# Patient Record
Sex: Male | Born: 1945 | Race: White | Hispanic: No | Marital: Single | Smoking: Current every day smoker
Health system: Southern US, Community
[De-identification: ages and names within clinical notes are randomized; demographics above are authoritative.]

## PROBLEM LIST (undated history)

## (undated) DIAGNOSIS — I4891 Unspecified atrial fibrillation: Secondary | ICD-10-CM

## (undated) DIAGNOSIS — I509 Heart failure, unspecified: Secondary | ICD-10-CM

## (undated) HISTORY — PX: FINGER SURGERY: SHX640

## (undated) HISTORY — PX: VASECTOMY: SHX75

---

## 2013-04-19 ENCOUNTER — Encounter: Payer: Self-pay | Admitting: Nurse Practitioner

## 2013-04-19 ENCOUNTER — Ambulatory Visit (INDEPENDENT_AMBULATORY_CARE_PROVIDER_SITE_OTHER): Payer: Self-pay | Admitting: Nurse Practitioner

## 2013-04-19 VITALS — BP 109/76 | HR 147 | Temp 97.6°F | Ht 69.0 in | Wt 228.0 lb

## 2013-04-19 DIAGNOSIS — R0602 Shortness of breath: Secondary | ICD-10-CM

## 2013-04-19 DIAGNOSIS — I872 Venous insufficiency (chronic) (peripheral): Secondary | ICD-10-CM

## 2013-04-19 DIAGNOSIS — I8310 Varicose veins of unspecified lower extremity with inflammation: Secondary | ICD-10-CM

## 2013-04-19 MED ORDER — POTASSIUM CHLORIDE ER 20 MEQ PO TBCR: 40.0000 meq | EXTENDED_RELEASE_TABLET | Freq: Three times a day (TID) | ORAL | Status: DC

## 2013-04-19 MED ORDER — FUROSEMIDE 80 MG PO TABS: 80.0000 mg | ORAL_TABLET | Freq: Two times a day (BID) | ORAL | Status: DC

## 2013-04-19 MED ORDER — METOPROLOL SUCCINATE ER 25 MG PO TB24: 12.5000 mg | ORAL_TABLET | Freq: Every day | ORAL | Status: DC

## 2013-04-19 NOTE — Progress Notes (Signed)
Subjective:    Samuel [name] is a 68 y.o. male who wishes to establish care and presents for evaluation of shortness of breath. Dyspnea has been moderate, and generally lasting all day and have been gradually worsening in last few days. Associated symptoms include: leg swelling and "feeling bad". The symptoms are aggravated by activity, talking, and relieved by diuretic: pt took 80 mg furosemide yesterday. Felt much better., rest, sitting up. Historical care provided by Samuel [name] in [place]. Pt saw him 1 year ago & was prescribed lisinopril, toprol, coumadin, & lasix. Pt has very poor understanding of why these meds were prescribed. He mentions that he had significant nocturia (4-5 times during night) before he started on lasix, then it was reduced to twice/night. Pt stopped all these meds about 1 ya as they "made me sick". He began to feel bad about 5 mos ago after having the "flu". He reports 40 lb. Weight loss in 5 mos. He states he is not eating because food eating makes him feel more SOB. In spite of weight loss, his abdomen feels full. He reports gradual LE swelling, worse in last week. He developed sore LLE few mos ago. Samuel [name] lives alone. He is a retired Emergency planning/management officerpolice officer.   The following portions of the patient's history were reviewed and updated as appropriate: allergies, current medications, past family history, past medical history, past social history, past surgical history and problem list.  Review of Systems Pertinent items are noted in HPI.    Objective:    Physical Exam BP 109/76  Pulse 147  Temp(Src) 97.6 F (36.4 C) (Temporal)  Ht 5\' 9"  (1.753 m)  Wt 228 lb (103.42 kg)  BMI 33.65 kg/m2  SpO2 98% General appearance: alert, cooperative, appears stated age and mild distress Head: Normocephalic, without obvious abnormality, atraumatic Eyes: negative findings: lids and lashes normal and conjunctivae and sclerae normal Lungs: diminished breath sounds bibasilar Heart:  rapid heart rate, distant tones, not perfusing every beat Extremities: edema +2 pitting edema from feet to knees and venous stasis dermatitis noted Pulses: 2+ and symmetric  Cardiographics ECG: supraventricular tachycardia Echocardiogram: not done  Imaging Chest x-ray: Pt refused.  Lab Review  No labs available    Assessment:    Shortness of breath secondary to heart failure SVT Discussed w/Samuel [name] (cardiologist). Pt should go to ER for diuresis, needs to be monitored due to SVT.     [name]    pt advised needs to be transported to ER by ambulance. Pt refuses ambulance & ER. Pt Asks, " what can you do for me?" I replied, " not enough, you need more care, need cardiac monitoring, electrolyte monitoring as fluid is diuresed. You could die on the way home." Pt acknowledges danger & seriousness of situation, yet is adamant that he will not go to hospital. Agrees to labs today & f/u in 2 days. Scripts written for lasix, potassium, & toprol.

## 2013-04-19 NOTE — Patient Instructions (Signed)
You are in heart failure. You need to be in the hospital. I recommend that you leave here & go to an ER. If you choose not to take this advise, you may die at home. Because you have refused ambulance and my medical advice, I have prescribed meds. Please take furosemide twice daily, toprol once daily, potassium three times daily. Please follow up in my office in 2 days if you have not gone to ER and are still alive.  Heart Failure Heart failure means your heart has trouble pumping blood. This makes it hard for your body to work well. Heart failure is usually a long-term (chronic) condition. You must take good care of yourself and follow your doctor's treatment plan. HOME CARE  Take your heart medicine as told by your doctor.  Do not stop taking medicine unless your doctor tells you to.  Do not skip any dose of medicine.  Refill your medicines before they run out.  Take other medicines only as told by your doctor or pharmacist.  Stay active if told by your doctor. The elderly and people with severe heart failure should talk with a doctor about physical activity.  Eat heart healthy foods. Choose foods that are without trans fat and are low in saturated fat, cholesterol, and salt (sodium). This includes fresh or frozen fruits and vegetables, fish, lean meats, fat-free or low-fat dairy foods, whole grains, and high-fiber foods. Lentils and dried peas and beans (legumes) are also good choices.  Limit salt if told by your doctor.  Cook in a healthy way. Roast, grill, broil, bake, poach, steam, or stir-fry foods.  Limit fluids as told by your doctor.  Weigh yourself every morning. Do this after you pee (urinate) and before you eat breakfast. Write down your weight to give to your doctor.  Take your blood pressure and write it down if your doctor tell you to.  Ask your doctor how to check your pulse. Check your pulse as told.  Lose weight if told by your doctor.  Stop smoking or chewing  tobacco. Do not use gum or patches that help you quit without your doctor's approval.  Schedule and go to doctor visits as told.  Nonpregnant women should have no more than 1 drink a day. Men should have no more than 2 drinks a day. Talk to your doctor about drinking alcohol.  Stop illegal drug use.  Stay current with shots (immunizations).  Manage your health conditions as told by your doctor.  Learn to manage your stress.  Rest when you are tired.  If it is really hot outside:  Avoid intense activities.  Use air conditioning or fans, or get in a cooler place.  Avoid caffeine and alcohol.  Wear loose-fitting, lightweight, and light-colored clothing.  If it is really cold outside:  Avoid intense activities.  Layer your clothing.  Wear mittens or gloves, a hat, and a scarf when going outside.  Avoid alcohol.  Learn about heart failure and get support as needed.  Get help to maintain or improve your quality of life and your ability to care for yourself as needed. GET HELP IF:   You gain 03 lb/1.4 kg or more in 1 day or 05 lb/2.3 kg in a week.  You are more short of breath than usual.  You cannot do your normal activities.  You tire easily.  You cough more than normal, especially with activity.  You have any or more puffiness (swelling) in areas such as your hands,  feet, ankles, or belly (abdomen).  You cannot sleep because it is hard to breathe.  You feel like your heart is beating fast (palpitations).  You get dizzy or lightheaded when you stand up. GET HELP RIGHT AWAY IF:   You have trouble breathing.  There is a change in mental status, such as becoming less alert or not being able to focus.  You have chest pain or discomfort.  You faint. MAKE SURE YOU:   Understand these instructions.  Will watch your condition.  Will get help right away if you are not doing well or get worse. Document Released: 10/23/2007 Document Revised: 05/10/2012  Document Reviewed: 08/14/2011 [facility] Patient Information 2014 [place], [place].

## 2013-04-19 NOTE — Assessment & Plan Note (Signed)
Likely secondary to heart failure. Historical meds: toprol, lisinopril, coumadin, furosemide.  Pt stopped all meds 1 yr ago because they "made him sick". SOB, but speaking in full sentences, LE edema, abdominal edema. ECG shows SVT. Recmd pt be transported to ER. Pt refuses to go to ER. No insurance. Worried about who will take care of his dogs. Advised pt he could die. Pt is coherent, states he is a [employer] and he is OK with death. Refuses recommended treatment. CMET, BNP today. Did not order cxr, or cardio ref as pt refuses.  Wrote scripts for furosemide, potassium, toprol. F/u 2 days.

## 2013-04-19 NOTE — Assessment & Plan Note (Signed)
Lesion about 3 cm diameter.  Not priority today. Will address at next visit.

## 2013-04-19 NOTE — Progress Notes (Signed)
Pre visit review using our clinic review tool, if applicable. No additional management support is needed unless otherwise documented below in the visit note. 

## 2013-04-20 LAB — COMPREHENSIVE METABOLIC PANEL
ALT: 14 U/L (ref 0–53)
AST: 25 U/L (ref 0–37)
Albumin: 4.4 g/dL (ref 3.5–5.2)
Alkaline Phosphatase: 49 U/L (ref 39–117)
BUN: 14 mg/dL (ref 6–23)
CALCIUM: 9.4 mg/dL (ref 8.4–10.5)
CHLORIDE: 103 meq/L (ref 96–112)
CO2: 28 meq/L (ref 19–32)
Creatinine, Ser: 1.1 mg/dL (ref 0.4–1.5)
GFR: 68.99 mL/min (ref >60.00)
GLUCOSE: 126 mg/dL — AB (ref 70–99)
POTASSIUM: 4.4 meq/L (ref 3.5–5.1)
Sodium: 139 mEq/L (ref 135–145)
Total Bilirubin: 2 mg/dL — ABNORMAL HIGH (ref 0.3–1.2)
Total Protein: 7.4 g/dL (ref 6.0–8.3)

## 2013-04-20 LAB — BRAIN NATRIURETIC PEPTIDE: PRO B NATRI PEPTIDE: 350 pg/mL — AB (ref 0.0–100.0)

## 2013-04-21 ENCOUNTER — Telehealth: Payer: Self-pay | Admitting: Nurse Practitioner

## 2013-04-21 NOTE — Telephone Encounter (Signed)
BNP 3 times nml. Significant heart failure. CMET : elevated bilirubin, o/w nml. Spoke w/pt. Stressed significance of severity of heart failure.  Again, advised he needs to be in hospital, then should see cardiologist. Again, pt refused. I explained his medicare part A will cover hospital care. Pt was not aware. Unfortunately, continues to refuse to go to hospital. He refuses ofc vist today. He agrees to ov in 1 week. Scheduled for 4/1 2 pm. Advised pt to check BP before taking 2nd lasix: if under 90/60 hold dose. Stressed importance of taking kdur.

## 2013-04-27 ENCOUNTER — Ambulatory Visit (INDEPENDENT_AMBULATORY_CARE_PROVIDER_SITE_OTHER): Payer: Self-pay | Admitting: Nurse Practitioner

## 2013-04-27 VITALS — BP 126/76 | HR 84 | Temp 98.4°F | Wt 224.0 lb

## 2013-04-27 DIAGNOSIS — M549 Dorsalgia, unspecified: Secondary | ICD-10-CM | POA: Insufficient documentation

## 2013-04-27 DIAGNOSIS — I8310 Varicose veins of unspecified lower extremity with inflammation: Secondary | ICD-10-CM

## 2013-04-27 DIAGNOSIS — I872 Venous insufficiency (chronic) (peripheral): Secondary | ICD-10-CM

## 2013-04-27 DIAGNOSIS — I509 Heart failure, unspecified: Secondary | ICD-10-CM | POA: Insufficient documentation

## 2013-04-27 DIAGNOSIS — R0602 Shortness of breath: Secondary | ICD-10-CM

## 2013-04-27 LAB — BASIC METABOLIC PANEL
BUN: 17 mg/dL (ref 6–23)
CO2: 29 mEq/L (ref 19–32)
Calcium: 9 mg/dL (ref 8.4–10.5)
Chloride: 99 mEq/L (ref 96–112)
Creatinine, Ser: 1 mg/dL (ref 0.4–1.5)
GFR: 76.85 mL/min (ref >60.00)
Glucose, Bld: 133 mg/dL — ABNORMAL HIGH (ref 70–99)
Potassium: 3.7 mEq/L (ref 3.5–5.1)
Sodium: 136 mEq/L (ref 135–145)

## 2013-04-27 LAB — POCT URINALYSIS DIPSTICK
Bilirubin, UA: NEGATIVE
Blood, UA: NEGATIVE
Glucose, UA: NEGATIVE
Ketones, UA: NEGATIVE
LEUKOCYTES UA: NEGATIVE
Nitrite, UA: NEGATIVE
PH UA: 6.5
PROTEIN UA: NEGATIVE
Spec Grav, UA: 1.01
Urobilinogen, UA: NEGATIVE

## 2013-04-27 LAB — BRAIN NATRIURETIC PEPTIDE: Pro B Natriuretic peptide (BNP): 480 pg/mL — ABNORMAL HIGH (ref 0.0–100.0)

## 2013-04-27 MED ORDER — TRIAMCINOLONE ACETONIDE 0.1 % EX CREA: 1.0000 "application " | TOPICAL_CREAM | Freq: Two times a day (BID) | CUTANEOUS | Status: DC

## 2013-04-27 MED ORDER — FUROSEMIDE 80 MG PO TABS: 40.0000 mg | ORAL_TABLET | Freq: Every day | ORAL | Status: DC

## 2013-04-27 MED ORDER — METOPROLOL SUCCINATE ER 25 MG PO TB24: 12.5000 mg | ORAL_TABLET | Freq: Every day | ORAL | Status: DC

## 2013-04-27 NOTE — Progress Notes (Signed)
Pre-visit discussion using our clinic review tool. No additional management support is needed unless otherwise documented below in the visit note.  

## 2013-04-27 NOTE — Progress Notes (Signed)
Subjective:    Samuel [name] is a 68 y.o. male who presents for follow-up of congestive heart failure. Pt was seen in ofc last week as new patient, presenting with SOB, tachycardic, and 3+ LE edema. Pt was in SVT per ofc ECG and was instructed to go to hospital due to severity of condition. Pt refused as he was concerned about cost-no insurance, and no one to care for dogs. Labs showed elevated BNP >300. CMET nml. I started metoprolol, lasix, potassium. Pt refused cardio referral for follow up, but agreed to see me today for f/u -his earliest availability.  Today he is much improved-started feeling "back to normal" about 3 days ago. Current symptoms include: fatigue. He denies chest pressure/discomfort, dyspnea, irregular heart beat and lower extremity edema. He states he is taking prescribed meds. He decreased lasix to once daily 3 da as he was not urinating frequently after second dose.  He c/o persistent back pain, onset 20 ya, worse in last week because he has been sitting more than usual due to SOB.  The following portions of the patient's history were reviewed and updated as appropriate: allergies, current medications, past family history, past medical history, past social history, past surgical history and problem list.  Review of Systems Pertinent items are noted in HPI.   Objective:    BP 126/76  Pulse 84  Temp(Src) 98.4 F (36.9 C) (Oral)  Wt 224 lb (101.606 kg)  SpO2 97% General appearance: alert, cooperative, appears stated age, no distress and pale Head: Normocephalic, without obvious abnormality, atraumatic Eyes: negative findings: lids and lashes normal and conjunctivae and sclerae normal Lungs: clear to auscultation bilaterally Heart: irregularly irregular rhythm and systolic murmur Abdomen: no edema, smaller girth than last week Extremities: edema none-much improved from last week and venous stasis dermatitis noted Pulses: 2+ and symmetric  Skin: pale, stasis derm L  LE-about 4 cm X 5 CM Assessment:    CHF: control uncertain, improved, needs improvement, needs to quit smoking, no significant medication side effects noted, stable and needs eval by cardiology-pt refuses due to cost  Stasis dermatitis Back pain, long-standing. Worse in last week [name]:    Disease process and medications discussed. Questions answered fully. Emphasized salt restriction. Encouraged daily monitoring of the patient's weight. Encouraged regular exercise. Follow up in 1 month.  Steroid cream. Ua & culture.

## 2013-04-27 NOTE — Patient Instructions (Addendum)
Decrease furosemide as discussed. You may add second dose if you are short of breath or feet are swelling.  Please consider seeing cardiologist. Go to hospital if you develop shortness of breath or chest pain. Follow up with Dr Drue Novel in 1 month.  Heart Failure Heart failure is a condition in which the heart has trouble pumping blood. This means your heart does not pump blood efficiently for your body to work well. In some cases of heart failure, fluid may back up into your lungs or you may have swelling (edema) in your lower legs. Heart failure is usually a long-term (chronic) condition. It is important for you to take good care of yourself and follow your caregiver's treatment plan. CAUSES  Some health conditions can cause heart failure. Those health conditions include:  High blood pressure (hypertension) causes the heart muscle to work harder than normal. When pressure in the blood vessels is high, the heart needs to pump (contract) with more force in order to circulate blood throughout the body. High blood pressure eventually causes the heart to become stiff and weak.  Coronary artery disease (CAD) is the buildup of cholesterol and fat (plaque) in the arteries of the heart. The blockage in the arteries deprives the heart muscle of oxygen and blood. This can cause chest pain and may lead to a heart attack. High blood pressure can also contribute to CAD.  Heart attack (myocardial infarction) occurs when 1 or more arteries in the heart become blocked. The loss of oxygen damages the muscle tissue of the heart. When this happens, part of the heart muscle dies. The injured tissue does not contract as well and weakens the heart's ability to pump blood.  Abnormal heart valves can cause heart failure when the heart valves do not open and close properly. This makes the heart muscle pump harder to keep the blood flowing.  Heart muscle disease (cardiomyopathy or myocarditis) is damage to the heart muscle from  a variety of causes. These can include drug or alcohol abuse, infections, or unknown reasons. These can increase the risk of heart failure.  Lung disease makes the heart work harder because the lungs do not work properly. This can cause a strain on the heart, leading it to fail.  Diabetes increases the risk of heart failure. High blood sugar contributes to high fat (lipid) levels in the blood. Diabetes can also cause slow damage to tiny blood vessels that carry important nutrients to the heart muscle. When the heart does not get enough oxygen and food, it can cause the heart to become weak and stiff. This leads to a heart that does not contract efficiently.  Other conditions can contribute to heart failure. These include abnormal heart rhythms, thyroid problems, and low blood counts (anemia). Certain unhealthy behaviors can increase the risk of heart failure. Those unhealthy behaviors include:  Being overweight.  Smoking or chewing tobacco.  Eating foods high in fat and cholesterol.  Abusing illicit drugs or alcohol.  Lacking physical activity. SYMPTOMS  Heart failure symptoms may vary and can be hard to detect. Symptoms may include:  Shortness of breath with activity, such as climbing stairs.  Persistent cough.  Swelling of the feet, ankles, legs, or abdomen.  Unexplained weight gain.  Difficulty breathing when lying flat (orthopnea).  Waking from sleep because of the need to sit up and get more air.  Rapid heartbeat.  Fatigue and loss of energy.  Feeling lightheaded, dizzy, or close to fainting.  Loss of appetite.  Nausea.  Increased urination during the night (nocturia). DIAGNOSIS  A diagnosis of heart failure is based on your history, symptoms, physical examination, and diagnostic tests. Diagnostic tests for heart failure may include:  Echocardiography.  Electrocardiography.  Chest X-ray.  Blood tests.  Exercise stress test.  Cardiac  angiography.  Radionuclide scans. TREATMENT  Treatment is aimed at managing the symptoms of heart failure. Medicines, behavioral changes, or surgical intervention may be necessary to treat heart failure.  Medicines to help treat heart failure may include:  Angiotensin-converting enzyme (ACE) inhibitors. This type of medicine blocks the effects of a blood protein called angiotensin-converting enzyme. ACE inhibitors relax (dilate) the blood vessels and help lower blood pressure.  Angiotensin receptor blockers. This type of medicine blocks the actions of a blood protein called angiotensin. Angiotensin receptor blockers dilate the blood vessels and help lower blood pressure.  Water pills (diuretics). Diuretics cause the kidneys to remove salt and water from the blood. The extra fluid is removed through urination. This loss of extra fluid lowers the volume of blood the heart pumps.  Beta blockers. These prevent the heart from beating too fast and improve heart muscle strength.  Digitalis. This increases the force of the heartbeat.  Healthy behavior changes include:  Obtaining and maintaining a healthy weight.  Stopping smoking or chewing tobacco.  Eating heart healthy foods.  Limiting or avoiding alcohol.  Stopping illicit drug use.  Physical activity as directed by your caregiver.  Surgical treatment for heart failure may include:  A procedure to open blocked arteries, repair damaged heart valves, or remove damaged heart muscle tissue.  A pacemaker to improve heart muscle function and control certain abnormal heart rhythms.  An internal cardioverter defibrillator to treat certain serious abnormal heart rhythms.  A left ventricular assist device to assist the pumping ability of the heart. HOME CARE INSTRUCTIONS   Take your medicine as directed by your caregiver. Medicines are important in reducing the workload of your heart, slowing the progression of heart failure, and  improving your symptoms.  Do not stop taking your medicine unless directed by your caregiver.  Do not skip any dose of medicine.  Refill your prescriptions before you run out of medicine. Your medicines are needed every day.  Take over-the-counter medicine only as directed by your caregiver or pharmacist.  Engage in moderate physical activity if directed by your caregiver. Moderate physical activity can benefit some people. The elderly and people with severe heart failure should consult with a caregiver for physical activity recommendations.  Eat heart healthy foods. Food choices should be free of trans fat and low in saturated fat, cholesterol, and salt (sodium). Healthy choices include fresh or frozen fruits and vegetables, fish, lean meats, legumes, fat-free or low-fat dairy products, and whole grain or high fiber foods. Talk to a dietitian to learn more about heart healthy foods.  Limit sodium if directed by your caregiver. Sodium restriction may reduce symptoms of heart failure in some people. Talk to a dietitian to learn more about heart healthy seasonings.  Use healthy cooking methods. Healthy cooking methods include roasting, grilling, broiling, baking, poaching, steaming, or stir-frying. Talk to a dietitian to learn more about healthy cooking methods.  Limit fluids if directed by your caregiver. Fluid restriction may reduce symptoms of heart failure in some people.  Weigh yourself every day. Daily weights are important in the early recognition of excess fluid. You should weigh yourself every morning after you urinate and before you eat breakfast. Wear the same  amount of clothing each time you weigh yourself. Record your daily weight. Provide your caregiver with your weight record.  Monitor and record your blood pressure if directed by your caregiver.  Check your pulse if directed by your caregiver.  Lose weight if directed by your caregiver. Weight loss may reduce symptoms of heart  failure in some people.  Stop smoking or chewing tobacco. Nicotine makes your heart work harder by causing your blood vessels to constrict. Do not use nicotine gum or patches before talking to your caregiver.  Schedule and attend follow-up visits as directed by your caregiver. It is important to keep all your appointments.  Limit alcohol intake to no more than 1 drink per day for nonpregnant women and 2 drinks per day for men. Drinking more than that is harmful to your heart. Tell your caregiver if you drink alcohol several times a week. Talk with your caregiver about whether alcohol is safe for you. If your heart has already been damaged by alcohol or you have severe heart failure, drinking alcohol should be stopped completely.  Stop illicit drug use.  Stay up-to-date with immunizations. It is especially important to prevent respiratory infections through current pneumococcal and influenza immunizations.  Manage other health conditions such as hypertension, diabetes, thyroid disease, or abnormal heart rhythms as directed by your caregiver.  Learn to manage stress.  Plan rest periods when fatigued.  Learn strategies to manage high temperatures. If the weather is extremely hot:  Avoid vigorous physical activity.  Use air conditioning or fans or seek a cooler location.  Avoid caffeine and alcohol.  Wear loose-fitting, lightweight, and light-colored clothing.  Learn strategies to manage cold temperatures. If the weather is extremely cold:  Avoid vigorous physical activity.  Layer clothes.  Wear mittens or gloves, a hat, and a scarf when going outside.  Avoid alcohol.  Obtain ongoing education and support as needed.  Participate or seek rehabilitation as needed to maintain or improve independence and quality of life. SEEK MEDICAL CARE IF:   Your weight increases by 03 lb/1.4 kg in 1 day or 05 lb/2.3 kg in a week.  You have increasing shortness of breath that is unusual for  you.  You are unable to participate in your usual physical activities.  You tire easily.  You cough more than normal, especially with physical activity.  You have any or more swelling in areas such as your hands, feet, ankles, or abdomen.  You are unable to sleep because it is hard to breathe.  You feel like your heart is beating fast (palpitations).  You become dizzy or lightheaded upon standing up. SEEK IMMEDIATE MEDICAL CARE IF:   You have difficulty breathing.  There is a change in mental status such as decreased alertness or difficulty with concentration.  You have a pain or discomfort in your chest.  You have an episode of fainting (syncope). MAKE SURE YOU:   Understand these instructions.  Will watch your condition.  Will get help right away if you are not doing well or get worse. Document Released: 01/13/2005 Document Revised: 05/10/2012 Document Reviewed: 02/05/2012 [facility] Patient Information 2014 [place], [place].

## 2013-04-29 LAB — URINE CULTURE
Colony Count: NO GROWTH
Organism ID, Bacteria: NO GROWTH

## 2013-05-02 ENCOUNTER — Telehealth: Payer: Self-pay | Admitting: Nurse Practitioner

## 2013-05-02 DIAGNOSIS — I509 Heart failure, unspecified: Secondary | ICD-10-CM

## 2013-05-02 MED ORDER — LISINOPRIL 10 MG PO TABS: 10.0000 mg | ORAL_TABLET | Freq: Every day | ORAL | Status: DC

## 2013-05-02 NOTE — Telephone Encounter (Signed)
Pt needs to start on ACEI. Will prescribe lisinopril 10 mg. Should have ECG at next ov as he was on coumadin in past-pt does not know why. Last ecg showed SVT, but he may have had underlying a-fib. Pt has no insurance and has refused cardio referral,  and hospital care when he was in acute failure. I have explained that his Medicare A will cover hospital admission. LM to adv pt to start lisinopril and sodium restriction to 2400 mg day. He is to f/u w/Dr Drue NovelPaz as the drive to [place] is too burdensome.

## 2013-05-16 NOTE — Telephone Encounter (Signed)
LMOM to CB. 

## 2013-05-17 NOTE — Telephone Encounter (Signed)
Spoke with pt and advised him of the message from [place]. Pt understood and states he is doing better. He has an appointment with Dr Drue NovelPaz on May 6 to follow up.

## 2013-06-01 ENCOUNTER — Ambulatory Visit: Payer: Self-pay | Admitting: Nurse Practitioner

## 2013-06-01 ENCOUNTER — Ambulatory Visit: Payer: Self-pay | Admitting: Internal Medicine

## 2013-06-08 ENCOUNTER — Encounter: Payer: Self-pay | Admitting: Internal Medicine

## 2013-06-08 ENCOUNTER — Ambulatory Visit (INDEPENDENT_AMBULATORY_CARE_PROVIDER_SITE_OTHER): Payer: Self-pay | Admitting: Internal Medicine

## 2013-06-08 VITALS — BP 90/60 | HR 80 | Temp 98.5°F | Wt 231.0 lb

## 2013-06-08 DIAGNOSIS — I509 Heart failure, unspecified: Secondary | ICD-10-CM

## 2013-06-08 NOTE — Progress Notes (Signed)
Pre visit review using our clinic review tool, if applicable. No additional management support is needed unless otherwise documented below in the visit note. 

## 2013-06-08 NOTE — Progress Notes (Signed)
Subjective:    Patient ID: Samuel [name], male    DOB: 03-20-1945, 68 y.o.   MRN: [id number]  DOS:  06/08/2013 Type of  visit: New patient to me, recently seen at this office by a different provider, chart reviewed, excerpts:  Samuel [name] is a 68 y.o. male who presents for follow-up of congestive heart failure. Pt was seen in ofc last week as new patient, presenting with SOB, tachycardic, and 3+ LE edema. Pt was in SVT per ofc ECG and was instructed to go to hospital due to severity of condition. Pt refused as Samuel was concerned about cost-no insurance, and no one to care for dogs. Labs showed elevated BNP >300. CMET nml. I started metoprolol, lasix, potassium. Pt refused cardio referral for follow up, but agreed to see me today for f/u -his earliest availability.   Today Samuel is much improved-started feeling "back to normal" about 3 days ago. Current symptoms include: fatigue. Samuel denies chest pressure/discomfort, dyspnea, irregular heart beat and lower extremity edema. Samuel states Samuel is taking prescribed meds. Samuel decreased lasix to once daily 3 da as Samuel was not urinating frequently after second dose.   Samuel c/o persistent back pain, onset 20 ya, worse in last week because Samuel has been sitting more than usual due to SOB.    ROS Since the last time Samuel was seen by Samuel [name] 04-27-13, Samuel is about the same, states that after the initiation of Lasix Samuel got  "60% better"  however at this time Samuel continue with dyspnea on exertion by talking, walking a few steps. Also for some reason Samuel stopped taking potassium and ACEi, Samuel is on Lasix. Complains of leg cramps at night.  Reports several months history of persistent pressure in the left anterior chest,  the pain is not on and off but steady, sometimes worse when Samuel eats?.  Denies fever chills Lower extremity edema improved. No palpitations No nausea, vomiting, diarrhea or blood in the stools. No cough or sputum production.  Past Medical History  Diagnosis  Date  . Asthma     No past surgical history on file.  History   Social History  . Marital Status: Single    Spouse Name: N/A    Number of Children: 2  . Years of Education: N/A   Occupational History  . retired    Social History Main Topics  . Smoking status: Not on file  . Smokeless tobacco: Not on file  . Alcohol Use: Not on file  . Drug Use: Not on file  . Sexual Activity: Not on file   Other Topics Concern  . 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 Samuel lives alone with his 2 dogs. [name]. Samuel has 3 grandchildren.        Medication List       This list is accurate as of: 06/08/13  2:46 PM.  Always use your most recent med list.               furosemide 80 MG tablet  Commonly known as:  LASIX  Take 0.5 tablets (40 mg total) by mouth daily. If SOB or LE swelling, add second dose PRN.     lisinopril 10 MG tablet  Commonly known as:  PRINIVIL,ZESTRIL  Take 1 tablet (10 mg total) by mouth daily.     metoprolol succinate 25 MG 24 hr tablet  Commonly  known as:  TOPROL-XL  Take 0.5 tablets (12.5 mg total) by mouth daily.     Potassium Chloride ER 20 MEQ Tbcr  Take 40 mEq by mouth 3 (three) times daily.     triamcinolone cream 0.1 %  Commonly known as:  KENALOG  Apply 1 application topically 2 (two) times daily. Use sparingly as can cause bleaching and atrophy.           Objective:   Physical Exam There were no vitals taken for this visit. General -- alert, well-developed .  Neck --+ JVD to jaw at 45 HEENT-- Not pale.  Lungs -- Decreased breath sounds at bases, a few crackles?Marland Kitchen. No wheezing. Unable to speak in complete sentences Heart-- Tachycardic, seems irregular, no murmur.  Abdomen-- Not distended, good bowel sounds,soft, non-tender. Extremities-- +/+++ pretibial edema bilaterally  Neurologic--  alert & oriented X3. Speech normal,   strength normal in all extremities.  Psych-- Cognition  and judgment appear intact. Cooperative with normal attention span and concentration. No anxious or depressed appearing.        Assessment & Plan:   Gradual development of difficulty breathing over the last few months, the patient has refused admission to the hospital or seeing the specialist x 2 before . Had partial response to Lasix. On exam Samuel is on heart failure. EKG afib w/ RVR My advice to the patient is: Go to the hospital via ambulance, needs to see cardiology, be admitted to telemetry, rule out for MI, iv diuresis, get a  echocardiogram et Karie Sodacetera. Also told pt  his condition is very treatable and Samuel could be back to normal within  days with proper care. The patient refuses be admitted to the hospital, furthermore, refuses to get a XRs or more labs I told the patient the seriousness of the situation, Samuel could die within minutes or days, if Samuel dies driving Samuel could kill somebody else. This was discussed in terms that a nonmedical person can understand. Samuel still refuses. Because his blood pressure is 90/60, I am afraid to change his medication in any way. After I discussed the case with cardiology over the phone,Samuel agreed with my assessment and recommendations. I told the patient to continue present medications, take an aspirin every day, if Samuel changes his mind at any time Samuel needs to call 911. Samuel expressed understanding and gratitude for my advice.  Chest pressure, steady and also complains of dysphagia sometimes; Samuel is a pipe smoker and at high risk for esophageal cancer, needs GI eval as well .Patient knows my opinion.  Today , I spent more than 30 min with the patient, >50% of the time counseling

## 2013-06-09 ENCOUNTER — Telehealth: Payer: Self-pay | Admitting: Nurse Practitioner

## 2013-06-09 NOTE — Telephone Encounter (Signed)
Relevant patient education mailed to patient.  

## 2013-07-13 ENCOUNTER — Other Ambulatory Visit: Payer: Self-pay | Admitting: Nurse Practitioner

## 2013-07-13 DIAGNOSIS — R0602 Shortness of breath: Secondary | ICD-10-CM

## 2013-07-13 MED ORDER — METOPROLOL SUCCINATE ER 25 MG PO TB24: 12.5000 mg | ORAL_TABLET | Freq: Every day | ORAL | Status: DC

## 2013-07-13 NOTE — Telephone Encounter (Signed)
Hi, This was sent to me by mistake. He is under your care now. Thanks, Eli Lilly and CompanyLayne

## 2013-07-13 NOTE — Telephone Encounter (Signed)
Patient requesting refill on Metoprolol and furosemide to [facility] [address]

## 2013-07-13 NOTE — Telephone Encounter (Signed)
Rx for metoprolol sent to pharmacy. Epic is not letting me sent the furosemide. Message says can't order because of directions. Please advise?

## 2013-07-15 ENCOUNTER — Telehealth: Payer: Self-pay | Admitting: *Deleted

## 2013-07-15 DIAGNOSIS — R0602 Shortness of breath: Secondary | ICD-10-CM

## 2013-07-15 MED ORDER — FUROSEMIDE 40 MG PO TABS: ORAL_TABLET | ORAL | Status: DC

## 2013-07-15 MED ORDER — METOPROLOL SUCCINATE ER 25 MG PO TB24: 12.5000 mg | ORAL_TABLET | Freq: Every day | ORAL | Status: DC

## 2013-07-15 MED ORDER — POTASSIUM CHLORIDE CRYS ER 20 MEQ PO TBCR: 20.0000 meq | EXTENDED_RELEASE_TABLET | Freq: Every day | ORAL | Status: DC

## 2013-07-15 NOTE — Telephone Encounter (Signed)
rx sent

## 2013-08-18 ENCOUNTER — Other Ambulatory Visit: Payer: Self-pay | Admitting: Nurse Practitioner

## 2013-08-18 DIAGNOSIS — R0602 Shortness of breath: Secondary | ICD-10-CM

## 2013-08-18 NOTE — Telephone Encounter (Deleted)
Please advise refill? 

## 2013-08-18 NOTE — Telephone Encounter (Signed)
Pt is seeing Dr Drue NovelPaz now. Please send to him.

## 2013-08-18 NOTE — Telephone Encounter (Signed)
Please advise refill for metoprolol? Patient just had last refill filled.

## 2013-08-19 ENCOUNTER — Other Ambulatory Visit: Payer: Self-pay | Admitting: *Deleted

## 2013-08-19 DIAGNOSIS — R0602 Shortness of breath: Secondary | ICD-10-CM

## 2013-08-19 MED ORDER — METOPROLOL SUCCINATE ER 25 MG PO TB24: 12.5000 mg | ORAL_TABLET | Freq: Every day | ORAL | Status: DC

## 2013-08-19 NOTE — Telephone Encounter (Signed)
Done

## 2013-08-19 NOTE — Telephone Encounter (Signed)
Please advise refill for metoprolol? Patient just had last refill filled. 

## 2013-09-08 ENCOUNTER — Other Ambulatory Visit: Payer: Self-pay | Admitting: Internal Medicine

## 2013-09-08 NOTE — Telephone Encounter (Signed)
Medication previously sent to Pharmacy on 08/19/2013.

## 2013-12-26 ENCOUNTER — Telehealth: Payer: Self-pay | Admitting: Nurse Practitioner

## 2013-12-26 DIAGNOSIS — R0602 Shortness of breath: Secondary | ICD-10-CM

## 2013-12-26 MED ORDER — METOPROLOL SUCCINATE ER 25 MG PO TB24: 12.5000 mg | ORAL_TABLET | Freq: Every day | ORAL | Status: DC

## 2013-12-26 NOTE — Telephone Encounter (Signed)
Pt called-states unable to breathe. Spoke with him for several minutes. He has not taken metoprolol in 4 mos. I advised him to go to ER. Pt in agreement to go to ER.  Will call in metoprolol.

## 2013-12-26 NOTE — Telephone Encounter (Signed)
Advised pt that he needs an appointment or he needs to go to ER if he is short of breath.  Pt insisted he speak with Layne.

## 2013-12-26 NOTE — Telephone Encounter (Signed)
Patient hasn't taken Metoprolol in 4 months. He has had trouble with the pharmacy. He is [facility] [address]. Patient is requesting a CB because he has been having trouble breathing.

## 2013-12-28 ENCOUNTER — Emergency Department (HOSPITAL_COMMUNITY): Payer: Medicare Other

## 2013-12-28 ENCOUNTER — Encounter (HOSPITAL_COMMUNITY): Payer: Self-pay | Admitting: Family Medicine

## 2013-12-28 ENCOUNTER — Inpatient Hospital Stay (HOSPITAL_COMMUNITY)
Admission: EM | Admit: 2013-12-28 | Discharge: 2013-12-30 | DRG: 292 | Discharge status: Against Medical Advice | Payer: Medicare Other | Attending: Interventional Cardiology | Admitting: Interventional Cardiology

## 2013-12-28 DIAGNOSIS — I509 Heart failure, unspecified: Secondary | ICD-10-CM

## 2013-12-28 DIAGNOSIS — Z9114 Patient's other noncompliance with medication regimen: Secondary | ICD-10-CM | POA: Diagnosis present

## 2013-12-28 DIAGNOSIS — Z9119 Patient's noncompliance with other medical treatment and regimen: Secondary | ICD-10-CM | POA: Diagnosis present

## 2013-12-28 DIAGNOSIS — I481 Persistent atrial fibrillation: Secondary | ICD-10-CM | POA: Diagnosis present

## 2013-12-28 DIAGNOSIS — M549 Dorsalgia, unspecified: Secondary | ICD-10-CM | POA: Diagnosis present

## 2013-12-28 DIAGNOSIS — I350 Nonrheumatic aortic (valve) stenosis: Secondary | ICD-10-CM | POA: Insufficient documentation

## 2013-12-28 DIAGNOSIS — R634 Abnormal weight loss: Secondary | ICD-10-CM | POA: Diagnosis present

## 2013-12-28 DIAGNOSIS — I4891 Unspecified atrial fibrillation: Secondary | ICD-10-CM | POA: Diagnosis present

## 2013-12-28 DIAGNOSIS — I5023 Acute on chronic systolic (congestive) heart failure: Principal | ICD-10-CM | POA: Diagnosis present

## 2013-12-28 DIAGNOSIS — R0602 Shortness of breath: Secondary | ICD-10-CM | POA: Diagnosis present

## 2013-12-28 DIAGNOSIS — F17210 Nicotine dependence, cigarettes, uncomplicated: Secondary | ICD-10-CM | POA: Diagnosis present

## 2013-12-28 DIAGNOSIS — I5022 Chronic systolic (congestive) heart failure: Secondary | ICD-10-CM | POA: Insufficient documentation

## 2013-12-28 DIAGNOSIS — Z91148 Patient's other noncompliance with medication regimen for other reason: Secondary | ICD-10-CM

## 2013-12-28 HISTORY — DX: Heart failure, unspecified: I50.9

## 2013-12-28 HISTORY — DX: Unspecified atrial fibrillation: I48.91

## 2013-12-28 LAB — PRO B NATRIURETIC PEPTIDE: PRO B NATRI PEPTIDE: 6571 pg/mL — AB (ref 0–125)

## 2013-12-28 LAB — CBC
HCT: 46.1 % (ref 39.0–52.0)
Hemoglobin: 16 g/dL (ref 13.0–17.0)
MCH: 32.9 pg (ref 26.0–34.0)
MCHC: 34.7 g/dL (ref 30.0–36.0)
MCV: 94.7 fL (ref 78.0–100.0)
Platelets: 194 10*3/uL (ref 150–400)
RBC: 4.87 MIL/uL (ref 4.22–5.81)
RDW: 14.1 % (ref 11.5–15.5)
WBC: 7.6 10*3/uL (ref 4.0–10.5)

## 2013-12-28 LAB — BASIC METABOLIC PANEL
Anion gap: 17 — ABNORMAL HIGH (ref 5–15)
BUN: 23 mg/dL (ref 6–23)
CALCIUM: 9 mg/dL (ref 8.4–10.5)
CO2: 20 meq/L (ref 19–32)
Chloride: 97 mEq/L (ref 96–112)
Creatinine, Ser: 0.97 mg/dL (ref 0.50–1.35)
GFR calc Af Amer: >90 mL/min (ref >90)
GFR, EST NON AFRICAN AMERICAN: 82 mL/min — AB (ref >90)
Glucose, Bld: 178 mg/dL — ABNORMAL HIGH (ref 70–99)
Potassium: 4.9 mEq/L (ref 3.7–5.3)
SODIUM: 134 meq/L — AB (ref 137–147)

## 2013-12-28 LAB — HEPARIN LEVEL (UNFRACTIONATED): Heparin Unfractionated: 0.45 IU/mL (ref 0.30–0.70)

## 2013-12-28 LAB — TROPONIN I
Troponin I: <0.3 ng/mL (ref <0.30)
Troponin I: <0.3 ng/mL (ref <0.30)

## 2013-12-28 LAB — TSH: TSH: 6.01 u[IU]/mL — ABNORMAL HIGH (ref 0.350–4.500)

## 2013-12-28 LAB — I-STAT TROPONIN, ED: Troponin i, poc: 0.01 ng/mL (ref 0.00–0.08)

## 2013-12-28 MED ORDER — DILTIAZEM HCL 100 MG IV SOLR
5.0000 mg/h | INTRAVENOUS | Status: DC
  Administered 2013-12-28: 12.5 mg/h via INTRAVENOUS
  Administered 2013-12-29: 10 mg/h via INTRAVENOUS
  Filled 2013-12-28: qty 100

## 2013-12-28 MED ORDER — HEPARIN BOLUS VIA INFUSION
4000.0000 [IU] | Freq: Once | INTRAVENOUS | Status: AC
  Administered 2013-12-28: 4000 [IU] via INTRAVENOUS
  Filled 2013-12-28: qty 4000

## 2013-12-28 MED ORDER — ASPIRIN EC 81 MG PO TBEC
81.0000 mg | DELAYED_RELEASE_TABLET | Freq: Every day | ORAL | Status: DC
  Administered 2013-12-29 – 2013-12-30 (×2): 81 mg via ORAL
  Filled 2013-12-28 (×2): qty 1

## 2013-12-28 MED ORDER — TRAMADOL HCL 50 MG PO TABS: 50.0000 mg | ORAL_TABLET | Freq: Four times a day (QID) | ORAL | Status: DC | PRN

## 2013-12-28 MED ORDER — SODIUM CHLORIDE 0.9 % IJ SOLN: 3.0000 mL | INTRAMUSCULAR | Status: DC | PRN

## 2013-12-28 MED ORDER — ACETAMINOPHEN 325 MG PO TABS: 650.0000 mg | ORAL_TABLET | ORAL | Status: DC | PRN

## 2013-12-28 MED ORDER — ASPIRIN 81 MG PO CHEW
324.0000 mg | CHEWABLE_TABLET | ORAL | Status: AC
  Administered 2013-12-28: 324 mg via ORAL
  Filled 2013-12-28: qty 4

## 2013-12-28 MED ORDER — DIGOXIN 0.25 MG/ML IJ SOLN
0.2500 mg | Freq: Four times a day (QID) | INTRAMUSCULAR | Status: AC
  Administered 2013-12-28 – 2013-12-29 (×3): 0.25 mg via INTRAVENOUS
  Filled 2013-12-28 (×5): qty 1

## 2013-12-28 MED ORDER — HEPARIN (PORCINE) IN NACL 100-0.45 UNIT/ML-% IJ SOLN
1400.0000 [IU]/h | INTRAMUSCULAR | Status: DC
  Administered 2013-12-28: 1300 [IU]/h via INTRAVENOUS
  Administered 2013-12-29: 1400 [IU]/h via INTRAVENOUS
  Administered 2013-12-29: 1300 [IU]/h via INTRAVENOUS
  Filled 2013-12-28 (×3): qty 250

## 2013-12-28 MED ORDER — DILTIAZEM HCL 100 MG IV SOLR
5.0000 mg/h | INTRAVENOUS | Status: DC
  Administered 2013-12-28: 5 mg/h via INTRAVENOUS

## 2013-12-28 MED ORDER — FUROSEMIDE 10 MG/ML IJ SOLN
40.0000 mg | Freq: Two times a day (BID) | INTRAMUSCULAR | Status: DC
  Administered 2013-12-28 – 2013-12-29 (×3): 40 mg via INTRAVENOUS
  Filled 2013-12-28 (×3): qty 4

## 2013-12-28 MED ORDER — ALPRAZOLAM 0.25 MG PO TABS: 0.2500 mg | ORAL_TABLET | Freq: Two times a day (BID) | ORAL | Status: DC | PRN

## 2013-12-28 MED ORDER — NITROGLYCERIN 0.4 MG SL SUBL: 0.4000 mg | SUBLINGUAL_TABLET | SUBLINGUAL | Status: DC | PRN

## 2013-12-28 MED ORDER — SODIUM CHLORIDE 0.9 % IV SOLN: 250.0000 mL | INTRAVENOUS | Status: DC | PRN

## 2013-12-28 MED ORDER — ASPIRIN 300 MG RE SUPP: 300.0000 mg | RECTAL | Status: AC

## 2013-12-28 MED ORDER — SODIUM CHLORIDE 0.9 % IJ SOLN
3.0000 mL | Freq: Two times a day (BID) | INTRAMUSCULAR | Status: DC
  Administered 2013-12-28 – 2013-12-29 (×4): 3 mL via INTRAVENOUS

## 2013-12-28 MED ORDER — POTASSIUM CHLORIDE CRYS ER 20 MEQ PO TBCR
20.0000 meq | EXTENDED_RELEASE_TABLET | Freq: Two times a day (BID) | ORAL | Status: DC
  Administered 2013-12-29 (×2): 20 meq via ORAL
  Filled 2013-12-28 (×3): qty 1

## 2013-12-28 MED ORDER — DILTIAZEM LOAD VIA INFUSION
15.0000 mg | Freq: Once | INTRAVENOUS | Status: AC
  Administered 2013-12-28: 15 mg via INTRAVENOUS
  Filled 2013-12-28: qty 15

## 2013-12-28 MED ORDER — OFF THE BEAT BOOK
Freq: Once | Status: AC
  Administered 2013-12-28: 19:00:00
  Filled 2013-12-28: qty 1

## 2013-12-28 MED ORDER — DIGOXIN 125 MCG PO TABS: 0.1250 mg | ORAL_TABLET | Freq: Every day | ORAL | Status: DC

## 2013-12-28 MED ORDER — ZOLPIDEM TARTRATE 5 MG PO TABS
5.0000 mg | ORAL_TABLET | Freq: Every evening | ORAL | Status: DC | PRN
  Administered 2013-12-28 – 2013-12-29 (×2): 5 mg via ORAL
  Filled 2013-12-28 (×2): qty 1

## 2013-12-28 MED ORDER — ONDANSETRON HCL 4 MG/2ML IJ SOLN: 4.0000 mg | Freq: Four times a day (QID) | INTRAMUSCULAR | Status: DC | PRN

## 2013-12-28 MED ORDER — FUROSEMIDE 10 MG/ML IJ SOLN
40.0000 mg | Freq: Once | INTRAMUSCULAR | Status: AC
  Administered 2013-12-28: 40 mg via INTRAVENOUS
  Filled 2013-12-28: qty 4

## 2013-12-28 MED ORDER — PANTOPRAZOLE SODIUM 40 MG PO TBEC
40.0000 mg | DELAYED_RELEASE_TABLET | Freq: Every day | ORAL | Status: DC
  Administered 2013-12-28 – 2013-12-30 (×3): 40 mg via ORAL
  Filled 2013-12-28 (×3): qty 1

## 2013-12-28 NOTE — ED Notes (Signed)
Pt here with 3 days of SOB, extremity swelling, not sleeping. sts when he lays down he feels like his throat is closing.

## 2013-12-28 NOTE — Plan of Care (Signed)
Problem: Phase I Progression Outcomes Goal: Anticoagulation Therapy per MD order Outcome: Completed/Met Date Met:  12/28/13 Goal: Heart rate or rhythm control medication Outcome: Completed/Met Date Met:  12/28/13 Goal: Pain controlled with appropriate interventions Outcome: Completed/Met Date Met:  12/28/13 Goal: Initial discharge plan identified Outcome: Completed/Met Date Met:  12/28/13 Goal: Hemodynamically stable Outcome: Completed/Met Date Met:  12/28/13     

## 2013-12-28 NOTE — Progress Notes (Signed)
ANTICOAGULATION CONSULT NOTE - Initial Consult  Pharmacy Consult for heparin Indication: atrial fibrillation  No Known Allergies  Patient Measurements: height 69 inches, weight 104 kg (per patient)   Heparin Dosing Weight: 93 kg  Vital Signs: Temp: 97.4 F (36.3 C) (12/02 1213) Temp Source: Oral (12/02 1213) BP: 110/98 mmHg (12/02 1430) Pulse Rate: 120 (12/02 1430)  Labs:  Recent Labs  12/28/13 1221  HGB 16.0  HCT 46.1  PLT 194  CREATININE 0.97    CrCl cannot be calculated (Unknown ideal weight.).   Medical History: Past Medical History  Diagnosis Date  . SOB (shortness of breath)     Medications:  See med rec  Assessment: Patient is a 68 y.o M presented to the ED with c/o SOB and was found to be in afib.  To start heparin for afib.  Patient denies hx recent bleeding events or on anticoagulants PTA.   Goal of Therapy:  Heparin level 0.3-0.7 units/ml Monitor platelets by anticoagulation protocol: Yes   Plan:  1) heparin 4000 units IV bolus x1, then drip at 1300 units/hr 2) check 6 hour heparin level  Cami Delawder P 12/28/2013,2:47 PM

## 2013-12-28 NOTE — ED Notes (Signed)
Attempted to call report

## 2013-12-28 NOTE — ED Provider Notes (Signed)
CSN: [id number]     Arrival date & time 12/28/13  1207 History   First MD Initiated Contact with Patient 12/28/13 1214     Chief Complaint  Patient presents with  . Shortness of Breath  . Leg Swelling     Patient is a 68 y.o. male presenting with shortness of breath. The history is provided by the patient.  Shortness of Breath   Samuel [name] presents for evaluation of progressive shortness breath. Reports 1 year of dyspnea that has been much worse over the last couple of weeks. Reports intermittent central chest pain and upper abdominal pain. And several weeks progressive leg edema. He reports his physician has encouraged him to come into the hospital. He reports his been out of his metoprolol for "a while" due to pharmacy issues. Lungs are severe, constant, worsening.  Past Medical History  Diagnosis Date  . SOB (shortness of breath)    Past Surgical History  Procedure Laterality Date  . Finger surgery     Family History  Problem Relation Age of Onset  . Heart disease Father   . Arrhythmia Father   . Alcohol abuse Father    History  Substance Use Topics  . Smoking status: Current Every Day Smoker    Types: Pipe  . Smokeless tobacco: Not on file     Comment: pipe 3 times /day  . Alcohol Use: No    Review of Systems  Respiratory: Positive for shortness of breath.   All other systems reviewed and are negative.     Allergies  Review of patient's allergies indicates no known allergies.  Home Medications   Prior to Admission medications   Medication Sig Start Date End Date Taking? Authorizing Provider  furosemide (LASIX) 40 MG tablet Take 1 tablet daily. If SOB or LE swelling, add second dose as needed. 07/15/13   Wanda PlumpJose E Paz, MD  metoprolol succinate (TOPROL-XL) 25 MG 24 hr tablet Take 0.5 tablets (12.5 mg total) by mouth daily. 12/26/13   Kelle DartingLayne C Weaver, NP  potassium chloride SA (K-DUR,KLOR-CON) 20 MEQ tablet Take 1 tablet (20 mEq total) by mouth daily. 07/15/13   Wanda PlumpJose E  Paz, MD  triamcinolone cream (KENALOG) 0.1 % Apply 1 application topically 2 (two) times daily. Use sparingly as can cause bleaching and atrophy. 04/27/13   Kelle DartingLayne C Weaver, NP   BP 125/101 mmHg  Pulse 160  Temp(Src) 97.4 F (36.3 C) (Oral)  Resp 24  SpO2 94% Physical Exam  Constitutional: He is oriented to person, place, and time. He appears well-developed and well-nourished.  HENT:  Head: Normocephalic and atraumatic.  Cardiovascular: Normal rate.   No murmur heard. Pulmonary/Chest: He is in respiratory distress.  Tachypneic, bibasilar crackles  Abdominal: Soft. There is no tenderness. There is no rebound and no guarding.  Musculoskeletal: He exhibits no tenderness.  3+ pitting edema in BLE  Neurological: He is alert and oriented to person, place, and time.  Skin: Skin is warm and dry.  Psychiatric: He has a normal mood and affect. His behavior is normal.  Nursing note and vitals reviewed.   ED Course  Procedures (including critical care time)  CRITICAL CARE Performed by: Tilden FossaEES, Parrish Bonn   Total critical care time: 30  Critical care time was exclusive of separately billable procedures and treating other patients.  Critical care was necessary to treat or prevent imminent or life-threatening deterioration.  Critical care was time spent personally by me on the following activities: development of treatment plan with patient  and/or surrogate as well as nursing, discussions with consultants, evaluation of patient's response to treatment, examination of patient, obtaining history from patient or surrogate, ordering and performing treatments and interventions, ordering and review of laboratory studies, ordering and review of radiographic studies, pulse oximetry and re-evaluation of patient's condition.  Labs Review Labs Reviewed  BASIC METABOLIC PANEL - Abnormal; Notable for the following:    Sodium 134 (*)    Glucose, Bld 178 (*)    GFR calc non Af Amer 82 (*)    Anion gap 17  (*)    All other components within normal limits  PRO B NATRIURETIC PEPTIDE - Abnormal; Notable for the following:    Pro B Natriuretic peptide (BNP) 6571.0 (*)    All other components within normal limits  CBC  TROPONIN I  TROPONIN I  TROPONIN I  TSH  I-STAT TROPOININ, ED    Imaging Review Dg Chest Port 1 View  12/28/2013   CLINICAL DATA:  Shortness of breath, chest pain.  EXAM: PORTABLE CHEST - 1 VIEW  COMPARISON:  None.  FINDINGS: The heart size and mediastinal contours are within normal limits. No pneumothorax or pleural effusion is noted. Small calcified granuloma is noted in left upper lobe. Mild right basilar opacity is noted concerning for pneumonia or subsegmental atelectasis. The visualized skeletal structures are unremarkable.  IMPRESSION: Mild right basilar pneumonia or subsegmental atelectasis. Followup radiographs are recommended until resolution.   Electronically Signed   By: Roque LiasJames  Green M.D.   On: 12/28/2013 13:21     EKG Interpretation   Date/Time:  Wednesday December 28 2013 12:13:02 EST Ventricular Rate:  171 PR Interval:    QRS Duration: 94 QT Interval:  238 QTC Calculation: 401 R Axis:   -102 Text Interpretation:  Atrial fibrillation with rapid ventricular response  Right superior axis deviation Pulmonary disease pattern Incomplete right  bundle branch block Right ventricular hypertrophy Septal infarct , age  undetermined Abnormal ECG Confirmed by Lincoln Brighamees, Liz [phone]([id number]) on 12/28/2013  1:15:58 PM      MDM   Final diagnoses:  Atrial fibrillation, new onset  Acute congestive heart failure, unspecified congestive heart failure type    Patient presents for evaluation of chest pain and shortness of breath. Patient here with new onset A. fib A. fib with RVR with pulmonary edema and congestive heart failure. Heart rate is improved with diltiazem drip. Chest x-ray with possible right basilar opacity, current clinical picture is not consistent with pneumonia.  Discussed with cardiology regarding admission for further treatment and evaluation.   Tilden FossaElizabeth Cletus Mehlhoff, MD 12/28/13 1534

## 2013-12-28 NOTE — ED Notes (Signed)
carfdiology consult at bedside

## 2013-12-28 NOTE — H&P (Signed)
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 He saw [facility] back in March with complaints of dyspnea. He was noted to be in AF. He was advised to go to the hospital for admission but he declined (no one to care for his dog). He started medical therapy and was improved on follow up a couple of weeks later. He declined further evaluation from cardiology. He was seen again in May, still a little SOB. Again cardiology evaluation was recommended. He is in the ER now with complaints of increasing dyspnea as well as intermittent chest pain and LE edema. He has been out of Lopressor for about a month.    Problem List: Past Medical History  Diagnosis Date  . SOB (shortness of breath)     Past Surgical History  Procedure Laterality Date  . Finger surgery       Allergies: No Known Allergies   Home Medications Current Facility-Administered Medications  Medication Dose Route Frequency Provider Last Rate Last Dose  . diltiazem (CARDIZEM) 100 mg in dextrose 5 % 100 mL (1 mg/mL) infusion  5-15 mg/hr Intravenous Continuous Tilden Fossa, MD 12.5 mL/hr at 12/28/13 1359 12.5 mg/hr at 12/28/13 1359   Current Outpatient Prescriptions  Medication Sig Dispense Refill  . furosemide (LASIX) 40 MG tablet Take 1 tablet daily. If SOB or LE swelling, add second dose as needed. 30 tablet 1  . metoprolol succinate (TOPROL-XL) 25 MG 24 hr tablet Take 0.5 tablets (12.5 mg total) by mouth daily. (Patient not taking: Reported on 12/28/2013) 30 tablet 0  . potassium chloride SA (K-DUR,KLOR-CON) 20 MEQ tablet Take 1 tablet (20 mEq total) by mouth daily. (Patient not taking: Reported on 12/28/2013) 30 tablet 1  . triamcinolone cream (KENALOG) 0.1 % Apply 1 application topically 2 (two) times daily. Use sparingly  as can cause bleaching and atrophy. (Patient not taking: Reported on 12/28/2013) 30 g 0     Family History  Problem Relation Age of Onset  . Heart disease Father   . Arrhythmia Father   . Alcohol abuse Father      History   Social History  . Marital Status: Single    Spouse Name: N/A    Number of Children: 2  . Years of Education: N/A   Occupational History  . retired    Social History Main Topics  . Smoking status: Current Every Day Smoker    Types: Pipe  . Smokeless tobacco: Not on file     Comment: pipe 3 times /day  . Alcohol Use: No  . Drug Use: No  . Sexual Activity: Not on file   Other Topics Concern  . 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    Lost a daughter to an accident   He lives alone with his 2 dogs. His Son lives in [place]. He has 3 grandchildren.     Review of Systems: See HPI General: negative for chills, fever, night sweats or weight changes.  Dermatological: negative for rash Urologic: negative for hematuria Abdominal: negative for nausea, vomiting, diarrhea, bright red blood per rectum, melena, or hematemesis Neurologic: negative for visual changes, syncope, or dizziness Chronic back pain All other systems reviewed and are otherwise negative except as noted above.  Physical Exam: Blood pressure 116/74, pulse 38, temperature 97.4 F (36.3 C), temperature source Oral, resp. rate 24, SpO2 95 %.  General appearance: alert, cooperative, mild distress and pale-SOB Neck: no carotid bruit and elevated JVP Lungs: decreased at bases Heart: irregularly irregular rhythm and fast rate Abdomen: mild tenderness Extremities: 2+ bilat LE edema Pulses: diminnished (B/P 90) Skin: pale cool dry Neurologic: Grossly normal    Labs:   Results for orders placed or performed during the hospital encounter of 12/28/13 (from the past 24 hour(s))  CBC     Status: None   Collection Time: 12/28/13 12:21 PM  Result  Value Ref Range   WBC 7.6 4.0 - 10.5 K/uL   RBC 4.87 4.22 - 5.81 MIL/uL   Hemoglobin 16.0 13.0 - 17.0 g/dL   HCT 16.146.1 09.639.0 - 04.552.0 %   MCV 94.7 78.0 - 100.0 fL   MCH 32.9 26.0 - 34.0 pg   MCHC 34.7 30.0 - 36.0 g/dL   RDW 40.914.1 81.111.5 - 91.415.5 %   Platelets 194 150 - 400 K/uL  Basic metabolic panel     Status: Abnormal   Collection Time: 12/28/13 12:21 PM  Result Value Ref Range   Sodium 134 (L) 137 - 147 mEq/L   Potassium 4.9 3.7 - 5.3 mEq/L   Chloride 97 96 - 112 mEq/L   CO2 20 19 - 32 mEq/L   Glucose, Bld 178 (H) 70 - 99 mg/dL   BUN 23 6 - 23 mg/dL   Creatinine, Ser 7.820.97 0.50 - 1.35 mg/dL   Calcium 9.0 8.4 - 95.610.5 mg/dL   GFR calc non Af Amer 82 (L) >90 mL/min   GFR calc Af Amer >90 >90 mL/min   Anion gap 17 (H) 5 - 15  BNP (order ONLY if patient complains of dyspnea/SOB AND you have documented it for THIS visit)     Status: Abnormal   Collection Time: 12/28/13 12:21 PM  Result Value Ref Range   Pro B Natriuretic peptide (BNP) 6571.0 (H) 0 - 125 pg/mL  I-stat troponin, ED (not at [facility])     Status: None   Collection Time: 12/28/13 12:28 PM  Result Value Ref Range   Troponin i, poc 0.01 0.00 - 0.08 ng/mL   Comment 3             Radiology/Studies: Dg Chest Port 1 View  12/28/2013   CLINICAL DATA:  Shortness of breath, chest pain.  EXAM: PORTABLE CHEST - 1 VIEW  COMPARISON:  None.  FINDINGS: The heart size and mediastinal contours are within normal limits. No pneumothorax or pleural effusion is noted. Small calcified granuloma is noted in left upper lobe. Mild right basilar opacity is noted concerning for pneumonia or subsegmental atelectasis. The visualized skeletal structures are unremarkable.  IMPRESSION: Mild right basilar pneumonia or subsegmental atelectasis. Followup radiographs are recommended until resolution.   Electronically Signed   By: Roque LiasJames  Green M.D.   On: 12/28/2013 13:21    EKG:AF with RVR, RBBB, poor ant RW  ASSESSMENT AND PLAN:  Principal Problem:   Acute  congestive heart failure Active Problems:   SOB (shortness of breath)   Atrial fibrillation with RVR   Back pain   Non compliance w medication regimen   PLAN: Lasix 40 mg IV now x 1. He agrees to admission. He is on Diltiazem IV but this has been cut back secondary to low B/P. Check echo when his HR is under better control. Cycle Troponin, add IV Heparin. Add  ASA and low dose beta blocker as B/P allows. Check lipids in AM.    Signed, Abelino DerrickKILROY,Nasario Czerniak K, PA-C 12/28/2013, 2:18 PM

## 2013-12-28 NOTE — ED Notes (Signed)
Oxygen placed nasal cannula 2 liters

## 2013-12-29 ENCOUNTER — Encounter (HOSPITAL_COMMUNITY): Payer: Self-pay | Admitting: General Practice

## 2013-12-29 LAB — BASIC METABOLIC PANEL
Anion gap: 13 (ref 5–15)
BUN: 23 mg/dL (ref 6–23)
CO2: 22 mEq/L (ref 19–32)
Calcium: 8.5 mg/dL (ref 8.4–10.5)
Chloride: 97 mEq/L (ref 96–112)
Creatinine, Ser: 0.98 mg/dL (ref 0.50–1.35)
GFR calc Af Amer: >90 mL/min (ref >90)
GFR calc non Af Amer: 81 mL/min — ABNORMAL LOW (ref >90)
Glucose, Bld: 123 mg/dL — ABNORMAL HIGH (ref 70–99)
Potassium: 4.2 mEq/L (ref 3.7–5.3)
Sodium: 132 mEq/L — ABNORMAL LOW (ref 137–147)

## 2013-12-29 LAB — T4, FREE: Free T4: 0.93 ng/dL (ref 0.80–1.80)

## 2013-12-29 LAB — LIPID PANEL
Cholesterol: 109 mg/dL (ref 0–200)
HDL: 23 mg/dL — ABNORMAL LOW (ref >39)
LDL Cholesterol: 75 mg/dL (ref 0–99)
Total CHOL/HDL Ratio: 4.7 RATIO
Triglycerides: 54 mg/dL (ref <150)
VLDL: 11 mg/dL (ref 0–40)

## 2013-12-29 LAB — CBC
HCT: 44.3 % (ref 39.0–52.0)
Hemoglobin: 15 g/dL (ref 13.0–17.0)
MCH: 32.4 pg (ref 26.0–34.0)
MCHC: 33.9 g/dL (ref 30.0–36.0)
MCV: 95.7 fL (ref 78.0–100.0)
Platelets: 167 10*3/uL (ref 150–400)
RBC: 4.63 MIL/uL (ref 4.22–5.81)
RDW: 14.1 % (ref 11.5–15.5)
WBC: 8.3 10*3/uL (ref 4.0–10.5)

## 2013-12-29 LAB — T3, FREE: T3, Free: 2.2 pg/mL — ABNORMAL LOW (ref 2.3–4.2)

## 2013-12-29 LAB — HEPARIN LEVEL (UNFRACTIONATED)
HEPARIN UNFRACTIONATED: 0.42 [IU]/mL (ref 0.30–0.70)
Heparin Unfractionated: 0.3 IU/mL (ref 0.30–0.70)

## 2013-12-29 LAB — TROPONIN I: Troponin I: <0.3 ng/mL (ref <0.30)

## 2013-12-29 MED ORDER — METOPROLOL TARTRATE 25 MG PO TABS
25.0000 mg | ORAL_TABLET | Freq: Four times a day (QID) | ORAL | Status: DC
  Administered 2013-12-29 – 2013-12-30 (×4): 25 mg via ORAL
  Filled 2013-12-29 (×5): qty 1

## 2013-12-29 MED ORDER — INFLUENZA VAC SPLIT QUAD 0.5 ML IM SUSY
0.5000 mL | PREFILLED_SYRINGE | INTRAMUSCULAR | Status: AC
  Administered 2013-12-30: 0.5 mL via INTRAMUSCULAR
  Filled 2013-12-29: qty 0.5

## 2013-12-29 NOTE — Care Management Note (Signed)
    Page 1 of 1   12/29/2013     10:57:56 AM CARE MANAGEMENT NOTE 12/29/2013  Patient:  Samuel [name],Samuel [name]   Account Number:  [id number]  Date Initiated:  12/29/2013  Documentation initiated by:  GRAVES-BIGELOW,Rhiannan Kievit  Subjective/Objective Assessment:   Pt admitted for chronic heart failure. HX afib with poor rate control. Initiated on cardizem gtt and Iv lasix.     Action/Plan:   Referral for medication assistance-CM will continue to monitor for disposition needs. [facility]  [facility]ication [facility], however pt is refusing HH services at this time.   Anticipated DC Date:  12/31/2013   Anticipated DC Plan:  HOME/SELF CARE      DC Planning Services  CM consult      Choice offered to / List presented to:             Status of service:  Completed, signed off Medicare Important Message given?   (If response is "NO", the following Medicare IM given date fields will be blank) Date Medicare IM given:   Medicare IM given by:   Date Additional Medicare IM given:   Additional Medicare IM given by:    Discharge Disposition:  HOME/SELF CARE  Per UR Regulation:  Reviewed for med. necessity/level of care/duration of stay  If discussed at Long Length of Stay Meetings, dates discussed:    Comments:  12-29-13 [address] Tomi BambergerBrenda Graves-[place]el[place], [place] [phone] Per pt he still drives and is not homebound. He states he weighs himself. Pt uses DME cane. [facility]acy is [facility]- pt without Rx drug coverage. CM did recommend pt to get part D Rx coverage. Pt states will cost too much money and his income is limited. No further needs at this time.

## 2013-12-29 NOTE — Progress Notes (Addendum)
ANTICOAGULATION CONSULT NOTE - Follow Up Consult  Pharmacy Consult for Heparin Indication: atrial fibrillation  No Known Allergies  Patient Measurements: Height: 5\' 9"  (175.3 cm) Weight: 227 lb 6.4 oz (103.148 kg) IBW/kg (Calculated) : 70.7 Heparin Dosing Weight: 93kg  Vital Signs: Temp: 97.5 F (36.4 C) (12/03 0748) Temp Source: Oral (12/03 0748) BP: 106/58 mmHg (12/03 0748) Pulse Rate: 85 (12/03 0748)  Labs:  Recent Labs  12/28/13 1221 12/28/13 1513 12/28/13 2113 12/28/13 2200 12/29/13 0237  HGB 16.0  --   --   --  15.0  HCT 46.1  --   --   --  44.3  PLT 194  --   --   --  167  HEPARINUNFRC  --   --   --  0.45 0.30  CREATININE 0.97  --   --   --  0.98  TROPONINI  --  <0.30 <0.30  --  <0.30    Estimated Creatinine Clearance: 83 mL/min (by C-G formula based on Cr of 0.98).   Medications:  Heparin 1300 units/hr  Assessment: 68yom continues on heparin for Afib. Heparin level (0.3) remains therapeutic but at lower end of goal range - will increase rate and check follow-up level to confirm therapeutic. - H/H wnl, Plts trending down - No significant bleeding reported - No problems with line/infusion  Goal of Therapy:  Heparin level 0.3-0.7 units/ml Monitor platelets by anticoagulation protocol: Yes   Plan:  1. Increase heparin drip to 1400 units/hr (14 ml/hr) 2. Check follow-up heparin level ~1500 to confirm therapeutic 3. Daily heparin level and CBC 4. Follow-up long-term anticoagulation plans  Cleon DewDulaney, [place] Robert  [phone] 12/29/2013,9:39 AM    Addendum: Follow-up heparin level (0.42) remains therapeutic - will continue current rate and follow-up AM labs.  Plan: 1. Continue heparin drip to 1400 units/hr (14 ml/hr) 2. Daily heparin level and CBC 3. Follow-up long-term anticoagulation plans  Wilfred LacyWesley America Sandall, PharmD Clinical Pharmacist [phone] 12/29/2013, 3:33 PM

## 2013-12-29 NOTE — Progress Notes (Signed)
After multiple attempts and education, patient continues to pull nasal cannula out of nose. Patient is alert and oriented, refusing to wear O2 nasal cannula with O2 sats in the 80's. Will continue to monitor patient.

## 2013-12-29 NOTE — Progress Notes (Signed)
Pt ambulated with walker to bathroom.  Has been on Room air only since 0730 today.  O2 sats remained 91-94% before, during, and after ambulation.  PT eval ordered per Dr. Tenny Crawoss.  Pt states he uses a cane at home and needs the "rubber foot" for the bottom of the cane.  Also needs medication assistance for metoprolol.  CM consult in.

## 2013-12-29 NOTE — Progress Notes (Signed)
ANTICOAGULATION CONSULT NOTE - Initial Consult  Pharmacy Consult for heparin Indication: atrial fibrillation  No Known Allergies  Patient Measurements: height 69 inches, weight 104 kg (per patient) Height: 5\' 9"  (175.3 cm) Weight: 228 lb 1.6 oz (103.465 kg) IBW/kg (Calculated) : 70.7 Heparin Dosing Weight: 93 kg  Vital Signs: Temp: 97.5 F (36.4 C) (12/02 2007) Temp Source: Oral (12/02 2007) BP: 113/68 mmHg (12/02 2007) Pulse Rate: 96 (12/02 2007)  Labs:  Recent Labs  12/28/13 1221 12/28/13 1513 12/28/13 2113 12/28/13 2200  HGB 16.0  --   --   --   HCT 46.1  --   --   --   PLT 194  --   --   --   HEPARINUNFRC  --   --   --  0.45  CREATININE 0.97  --   --   --   TROPONINI  --  <0.30 <0.30  --     Estimated Creatinine Clearance: 84 mL/min (by C-G formula based on Cr of 0.97).   Medical History: Past Medical History  Diagnosis Date  . SOB (shortness of breath)     Medications:  Heparin infusing at 1300 units/hr  Assessment: Heparin level therapeutic at 0.45 IU/ml. No bleeding noted. Goal of Therapy:  Heparin level 0.3-0.7 units/ml Monitor platelets by anticoagulation protocol: Yes   Plan:  Continue at 1300/hr and f/u am labs  Janice CoffinEarl, Khalee Mazo Jonathan

## 2013-12-29 NOTE — Progress Notes (Signed)
UR Completed Jamison Soward Graves-Bigelow, RN,BSN [phone]  

## 2013-12-29 NOTE — Progress Notes (Signed)
Subjective: No CP  Breathing better    Objective: Filed Vitals:   12/29/13 0509 12/29/13 0550 12/29/13 0650 12/29/13 0748  BP:  97/59 106/88 106/58  Pulse: 63 58 89 85  Temp: 97.4 F (36.3 C)   97.5 F (36.4 C)  TempSrc: Oral   Oral  Resp: 25 21 19 20   Height:      Weight: 227 lb 6.4 oz (103.148 kg)     SpO2: 94% 96% 94% 90%   Weight change:   Intake/Output Summary (Last 24 hours) at 12/29/13 0943 Last data filed at 12/29/13 0900  Gross per 24 hour  Intake  685.5 ml  Output   2325 ml  Net -1639.5 ml    General: Alert, awake, oriented x3, in no acute distress Neck:  JVP is increased   Heart: irregular rate and rhythm, without murmurs, rubs, gallops.  Lungs: Clear to auscultation.  No rales or wheezes. Exemities:  1-2 + edema.   Neuro: Grossly intact, nonfocal.  Tele Afib  Rates 80s   Lab Results: Results for orders placed or performed during the hospital encounter of 12/28/13 (from the past 24 hour(s))  CBC     Status: None   Collection Time: 12/28/13 12:21 PM  Result Value Ref Range   WBC 7.6 4.0 - 10.5 K/uL   RBC 4.87 4.22 - 5.81 MIL/uL   Hemoglobin 16.0 13.0 - 17.0 g/dL   HCT 40.946.1 81.139.0 - 91.452.0 %   MCV 94.7 78.0 - 100.0 fL   MCH 32.9 26.0 - 34.0 pg   MCHC 34.7 30.0 - 36.0 g/dL   RDW 78.214.1 95.611.5 - 21.315.5 %   Platelets 194 150 - 400 K/uL  Basic metabolic panel     Status: Abnormal   Collection Time: 12/28/13 12:21 PM  Result Value Ref Range   Sodium 134 (L) 137 - 147 mEq/L   Potassium 4.9 3.7 - 5.3 mEq/L   Chloride 97 96 - 112 mEq/L   CO2 20 19 - 32 mEq/L   Glucose, Bld 178 (H) 70 - 99 mg/dL   BUN 23 6 - 23 mg/dL   Creatinine, Ser 0.860.97 0.50 - 1.35 mg/dL   Calcium 9.0 8.4 - 57.810.5 mg/dL   GFR calc non Af Amer 82 (L) >90 mL/min   GFR calc Af Amer >90 >90 mL/min   Anion gap 17 (H) 5 - 15  BNP (order ONLY if patient complains of dyspnea/SOB AND you have documented it for THIS visit)     Status: Abnormal   Collection Time: 12/28/13 12:21 PM  Result Value Ref  Range   Pro B Natriuretic peptide (BNP) 6571.0 (H) 0 - 125 pg/mL  I-stat troponin, ED (not at [facility])     Status: None   Collection Time: 12/28/13 12:28 PM  Result Value Ref Range   Troponin i, poc 0.01 0.00 - 0.08 ng/mL   Comment 3          Troponin I-(serum)     Status: None   Collection Time: 12/28/13  3:13 PM  Result Value Ref Range   Troponin I <0.30 <0.30 ng/mL  TSH     Status: Abnormal   Collection Time: 12/28/13  3:13 PM  Result Value Ref Range   TSH 6.010 (H) 0.350 - 4.500 uIU/mL  Troponin I-(serum)     Status: None   Collection Time: 12/28/13  9:13 PM  Result Value Ref Range   Troponin I <0.30 <0.30 ng/mL  Heparin level (unfractionated)  Status: None   Collection Time: 12/28/13 10:00 PM  Result Value Ref Range   Heparin Unfractionated 0.45 0.30 - 0.70 IU/mL  Troponin I-(serum)     Status: None   Collection Time: 12/29/13  2:37 AM  Result Value Ref Range   Troponin I <0.30 <0.30 ng/mL  CBC     Status: None   Collection Time: 12/29/13  2:37 AM  Result Value Ref Range   WBC 8.3 4.0 - 10.5 K/uL   RBC 4.63 4.22 - 5.81 MIL/uL   Hemoglobin 15.0 13.0 - 17.0 g/dL   HCT 16.144.3 09.639.0 - 04.552.0 %   MCV 95.7 78.0 - 100.0 fL   MCH 32.4 26.0 - 34.0 pg   MCHC 33.9 30.0 - 36.0 g/dL   RDW 40.914.1 81.111.5 - 91.415.5 %   Platelets 167 150 - 400 K/uL  Basic metabolic panel     Status: Abnormal   Collection Time: 12/29/13  2:37 AM  Result Value Ref Range   Sodium 132 (L) 137 - 147 mEq/L   Potassium 4.2 3.7 - 5.3 mEq/L   Chloride 97 96 - 112 mEq/L   CO2 22 19 - 32 mEq/L   Glucose, Bld 123 (H) 70 - 99 mg/dL   BUN 23 6 - 23 mg/dL   Creatinine, Ser 7.820.98 0.50 - 1.35 mg/dL   Calcium 8.5 8.4 - 95.610.5 mg/dL   GFR calc non Af Amer 81 (L) >90 mL/min   GFR calc Af Amer >90 >90 mL/min   Anion gap 13 5 - 15  Heparin level (unfractionated)     Status: None   Collection Time: 12/29/13  2:37 AM  Result Value Ref Range   Heparin Unfractionated 0.30 0.30 - 0.70 IU/mL  Lipid panel     Status: Abnormal    Collection Time: 12/29/13  2:37 AM  Result Value Ref Range   Cholesterol 109 0 - 200 mg/dL   Triglycerides 54 <213<150 mg/dL   HDL 23 (L) >08>39 mg/dL   Total CHOL/HDL Ratio 4.7 RATIO   VLDL 11 0 - 40 mg/dL   LDL Cholesterol 75 0 - 99 mg/dL    Studies/Results: Dg Chest Port 1 View  12/28/2013   CLINICAL DATA:  Shortness of breath, chest pain.  EXAM: PORTABLE CHEST - 1 VIEW  COMPARISON:  None.  FINDINGS: The heart size and mediastinal contours are within normal limits. No pneumothorax or pleural effusion is noted. Small calcified granuloma is noted in left upper lobe. Mild right basilar opacity is noted concerning for pneumonia or subsegmental atelectasis. The visualized skeletal structures are unremarkable.  IMPRESSION: Mild right basilar pneumonia or subsegmental atelectasis. Followup radiographs are recommended until resolution.   Electronically Signed   By: Roque LiasJames  Green M.D.   On: 12/28/2013 13:21    Medications: Reviewed     @PROBHOSP @  1  Atrial fibrillation  Patinet on IV dilt  Rates are controlled  Wil switch to PO lopressor Follow rates    2  Acute CHF  Echo is pending  Still with evid of volume overload on exam  Contineu diuresis.    3.  Thyroid  TSH is elevated  Will check panel    LOS: 1 day   Dietrich Patesaula Kail Fraley 12/29/2013, 9:43 AM

## 2013-12-29 NOTE — Progress Notes (Signed)
Pt not sure if he agrees to cath in am.  He says that it "sounds like an exploratory type of procedure and does not want to go through with it if it is not absolutely necessary".  He is very anxious to get d/c home and get back home to care for his golden lab (at kennel that will close Saturday at noon).  PA on call Ronita Hipps(Katheryn Thompson) notified.  MD and PA to see pt in am.  Pt informed.

## 2013-12-29 NOTE — Progress Notes (Signed)
  Echocardiogram 2D Echocardiogram has been performed.  Tyaira Heward FRANCES 12/29/2013, 12:47 PM

## 2013-12-29 NOTE — Progress Notes (Signed)
Echo shows LVEF 30 to 35% with diffuse hypokinesis, worse in the inferior wall  There appears to be moderate AS I have reviewed with patient   With these findings I would recomm R / L heart cattheterization to define anatomy  Patient informed of risks  Understands and agrees to proceed WIll sched for AM

## 2013-12-29 NOTE — Progress Notes (Signed)
Pt sitting up in bed eating Breakfast.  States, "I had to wear my oxygen all night long and I'm not putting up with that during the day".  O2 sats 93% on RA.  Pt in no distress.  Will continue to monitor.

## 2013-12-30 ENCOUNTER — Telehealth: Payer: Self-pay | Admitting: Interventional Cardiology

## 2013-12-30 DIAGNOSIS — I350 Nonrheumatic aortic (valve) stenosis: Secondary | ICD-10-CM | POA: Insufficient documentation

## 2013-12-30 DIAGNOSIS — I5023 Acute on chronic systolic (congestive) heart failure: Secondary | ICD-10-CM | POA: Diagnosis not present

## 2013-12-30 DIAGNOSIS — I5022 Chronic systolic (congestive) heart failure: Secondary | ICD-10-CM | POA: Insufficient documentation

## 2013-12-30 LAB — HEPARIN LEVEL (UNFRACTIONATED): HEPARIN UNFRACTIONATED: 0.52 [IU]/mL (ref 0.30–0.70)

## 2013-12-30 LAB — BASIC METABOLIC PANEL
Anion gap: 16 — ABNORMAL HIGH (ref 5–15)
BUN: 20 mg/dL (ref 6–23)
CO2: 24 mEq/L (ref 19–32)
Calcium: 8.4 mg/dL (ref 8.4–10.5)
Chloride: 94 mEq/L — ABNORMAL LOW (ref 96–112)
Creatinine, Ser: 0.88 mg/dL (ref 0.50–1.35)
GFR calc Af Amer: >90 mL/min (ref >90)
GFR calc non Af Amer: 85 mL/min — ABNORMAL LOW (ref >90)
Glucose, Bld: 114 mg/dL — ABNORMAL HIGH (ref 70–99)
Potassium: 3.8 mEq/L (ref 3.7–5.3)
Sodium: 134 mEq/L — ABNORMAL LOW (ref 137–147)

## 2013-12-30 LAB — CBC
HEMATOCRIT: 45.7 % (ref 39.0–52.0)
Hemoglobin: 15.5 g/dL (ref 13.0–17.0)
MCH: 32.4 pg (ref 26.0–34.0)
MCHC: 33.9 g/dL (ref 30.0–36.0)
MCV: 95.6 fL (ref 78.0–100.0)
PLATELETS: 197 10*3/uL (ref 150–400)
RBC: 4.78 MIL/uL (ref 4.22–5.81)
RDW: 14.2 % (ref 11.5–15.5)
WBC: 8.7 10*3/uL (ref 4.0–10.5)

## 2013-12-30 LAB — MRSA PCR SCREENING: MRSA by PCR: NEGATIVE

## 2013-12-30 MED ORDER — RIVAROXABAN 20 MG PO TABS: 20.0000 mg | ORAL_TABLET | Freq: Every day | ORAL | Status: DC

## 2013-12-30 MED ORDER — ASPIRIN 81 MG PO TBEC: 81.0000 mg | DELAYED_RELEASE_TABLET | Freq: Every day | ORAL | Status: DC

## 2013-12-30 MED ORDER — RIVAROXABAN 20 MG PO TABS
20.0000 mg | ORAL_TABLET | Freq: Every day | ORAL | Status: DC
Start: 2013-12-30 — End: 2013-12-30

## 2013-12-30 MED ORDER — FUROSEMIDE 40 MG PO TABS
40.0000 mg | ORAL_TABLET | Freq: Every day | ORAL | Status: DC
  Administered 2013-12-30: 40 mg via ORAL
  Filled 2013-12-30: qty 1

## 2013-12-30 MED ORDER — POTASSIUM CHLORIDE CRYS ER 10 MEQ PO TBCR
10.0000 meq | EXTENDED_RELEASE_TABLET | Freq: Every day | ORAL | Status: DC
  Administered 2013-12-30: 10 meq via ORAL

## 2013-12-30 MED ORDER — METOPROLOL TARTRATE 50 MG PO TABS: 25.0000 mg | ORAL_TABLET | Freq: Two times a day (BID) | ORAL | Status: DC

## 2013-12-30 MED ORDER — METOPROLOL TARTRATE 25 MG PO TABS: 25.0000 mg | ORAL_TABLET | Freq: Four times a day (QID) | ORAL | Status: DC

## 2013-12-30 MED ORDER — FUROSEMIDE 40 MG PO TABS: ORAL_TABLET | ORAL | Status: DC

## 2013-12-30 NOTE — Progress Notes (Signed)
Medicare Important Message given? YES  (If response is "NO", the following Medicare IM given date fields will be blank)  Date Medicare IM given:  Medicare IM given by: Tomi BambergerGraves-Bigelow, Neleh Muldoon   RN provided pt with the 30 day free xarelto card. Pt will f/u at MD office for additional assistance with xarelto. Gala LewandowskyGraves-Bigelow, Sean Malinowski Kaye, RN,BSN Care Manager

## 2013-12-30 NOTE — Telephone Encounter (Signed)
New message     TCM appt on 12/8  With Carlean JewsKatie Thompson.

## 2013-12-30 NOTE — Discharge Summary (Signed)
Discharge Summary   Patient ID: Samuel [name] MRN: [id number], DOB/AGE: Mar 19, 1945 68 y.o. Admit date: 12/28/2013 D/C date:     12/30/2013  Primary Cardiologist: Dr Katrinka BlazingSmith (new)  Principal Problem:   Acute congestive heart failure Active Problems:   Back pain   Atrial fibrillation with RVR   Non compliance w medication regimen   Aortic stenosis   Acute on chronic systolic CHF (congestive heart failure)    Admission Dates: 12/28/13-12/30/13 Discharge Diagnosis: acute on chronic systolic CHF and persistent atrial fibrillation. Leaving AMA.  HPI: Samuel [name] is a 68 y.o. male with a history of chronic CHF, atrial fibrillation and medical non compliance who [facility] with afib with RVR and massive volume overload.   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 He saw [facility] primary care back in March with complaints of dyspnea. He was noted to be in AF. He was advised to go to the hospital for admission but he declined (no one to care for his dog). He started medical therapy and was improved on follow up a couple of weeks later. He declined further evaluation from cardiology. He was seen again in May, still a little SOB. Again cardiology evaluation was recommended. He then presented to the ER on 12/28/13 with complaints of increasing dyspnea as well as intermittent chest pain and LE edema. He has been out of Lopressor for about a month.    Hospital Course  Atrial fibrillation- He is still in afib. Rates are better.  -- Will send home with free month of Xarelto, Metoprolol 50mg  BID -- Coumadin not an option with non compliance  Acute on chronic systolic CHF- 2D ECHO with newly reduced EF (30-35%) and akinesis of basal-midinferior myocardium. This may be tachycardia mediated but LHC was recommended with newly reduced EF to rule out ischemia. He is refusing this because he needs to go home to take care of his dog and doesn't want "exploratory"  surgery, -- Still with volume overload. Switched to Po lasix today because patient wants to leave AMA. He is aware that this could cause relapse of volume overload very quickly -- He was told that it is imperative that he follow up in clinic to have kidney function checked and blood counts checked Otherwise we cannot prescribe medicines for afib or CHF (lasix, anticoagulant). He thought about this and said he would -- No ACE as he will not take this. Continue BB  Aortic stenosis- 2D ECHO with moderate stenosis. Peak velocity (S): 331cm/s. Mean gradient (S): 21 mm Hg. -- Should have follow up  Weight loss- He has had a 50 pound weight loss over the past 6-9 months, related to anorexia from suspected hepatic congestion/cardiac cachexia.   The patient has had an uncomplicated hospital course and is recovering well. He has been seen by Dr. Tenny Crawoss today and he not felt ready for discharge. He is leaving AMA. He was informed that by signing AMA current admit may not be covered. Social work was not able to see him due to his leaving AMA. All follow-up appointments have been scheduled. He will follow up on 01/03/14 with me. Discharge medications are listed below.    Discharge Vitals: Blood pressure 90/71, pulse 72, temperature 97.7 F (36.5 C), temperature source Oral, resp. rate 19, height 5\' 9"  (1.753 m), weight 223 lb 12.3 oz (101.5 kg), SpO2 90 %.  Labs: Lab Results  Component Value Date   WBC  8.7 12/30/2013   HGB 15.5 12/30/2013   HCT 45.7 12/30/2013   MCV 95.6 12/30/2013   PLT 197 12/30/2013     Recent Labs Lab 12/30/13 0520  NA 134*  K 3.8  CL 94*  CO2 24  BUN 20  CREATININE 0.88  CALCIUM 8.4  GLUCOSE 114*    Recent Labs  12/28/13 1513 12/28/13 2113 12/29/13 0237  TROPONINI <0.30 <0.30 <0.30   Lab Results  Component Value Date   CHOL 109 12/29/2013   HDL 23* 12/29/2013   LDLCALC 75 12/29/2013   TRIG 54 12/29/2013     Diagnostic Studies/Procedures   Dg Chest  Port 1 View  12/28/2013   CLINICAL DATA:  Shortness of breath, chest pain.  EXAM: PORTABLE CHEST - 1 VIEW  COMPARISON:  None.  FINDINGS: The heart size and mediastinal contours are within normal limits. No pneumothorax or pleural effusion is noted. Small calcified granuloma is noted in left upper lobe. Mild right basilar opacity is noted concerning for pneumonia or subsegmental atelectasis. The visualized skeletal structures are unremarkable.  IMPRESSION: Mild right basilar pneumonia or subsegmental atelectasis. Followup radiographs are recommended until resolution.   Electronically Signed   By: Roque Lias M.D.   On: 12/28/2013 13:21   2D ECHO  Study Date: 12/29/2013 LV EF: 30% -  35% Study Conclusions - Left ventricle: The cavity size was normal. Systolic function was moderately to severely reduced. The estimated ejection fraction was in the range of 30% to 35%. There is akinesis of the basal-midinferior myocardium. - Aortic valve: There was moderate stenosis. Peak velocity (S): 331 cm/s. Mean gradient (S): 21 mm Hg. - Mitral valve: Calcified annulus. There was mild to moderate regurgitation. - Left atrium: The atrium was mildly dilated. - Right ventricle: The cavity size was mildly dilated. Wall thickness was normal. - Right atrium: The atrium was mildly dilated. - Pulmonary arteries: PA peak pressure: 44 mm Hg (S). - Pericardium, extracardiac: There was a moderate-sized left pleural effusion.   Discharge Medications     Medication List    STOP taking these medications        metoprolol succinate 25 MG 24 hr tablet  Commonly known as:  TOPROL-XL      TAKE these medications        aspirin 81 MG EC tablet  Take 1 tablet (81 mg total) by mouth daily.     furosemide 40 MG tablet  Commonly known as:  LASIX  Take 1 tablet daily. If SOB or LE swelling, add second dose as needed.     metoprolol 50 MG tablet  Commonly known as:  LOPRESSOR  Take 0.5 tablets (25  mg total) by mouth 2 (two) times daily.     potassium chloride SA 20 MEQ tablet  Commonly known as:  K-DUR,KLOR-CON  Take 1 tablet (20 mEq total) by mouth daily.     rivaroxaban 20 MG Tabs tablet  Commonly known as:  XARELTO  Take 1 tablet (20 mg total) by mouth daily with supper.     triamcinolone cream 0.1 %  Commonly known as:  KENALOG  Apply 1 application topically 2 (two) times daily. Use sparingly as can cause bleaching and atrophy.        Disposition   The patient will be discharged in unstable condition to home. PATIENT LEAVING AMA  Follow-up Information    Follow up with Samuel Hora, PA-C On 01/03/2014.   Specialty:  Cardiology   Why:  9am  Contact information:   [address] [place] [id number] [phone]         Duration of Discharge Encounter: Greater than 30 minutes including physician and PA time.  SignedCline Crock, THOMPSON, KATHRYN R PA-C 12/30/2013, 11:14 AM   patinet seen and examined earlier  He knows decision to leave is AMA  Will plan close f/u as outpatinet.    Dietrich PatesPaula Violeta Lecount

## 2013-12-30 NOTE — Progress Notes (Signed)
Pt leaving AMA (paper signed/witnessed in chart).  Advised pt of the importance to follow up at Cardiology appointment scheduled for 01/03/2014 at 9am.  New Meds prescribed and pt aware of when to take them.  He knows that it is very important to continue his metoprolol and start taking lasix daily.  He also knows that he is leaving AMA and his condition is unstable.

## 2013-12-30 NOTE — Progress Notes (Signed)
Subjective: Patient's berathing is bettter  No CP After reflecting last night he does not want to have heart cath.  He wants to go home today Objective: Filed Vitals:   12/29/13 1840 12/29/13 2002 12/30/13 0547 12/30/13 0549  BP: 106/71 116/87 81/55 88/66   Pulse:  61 72 72  Temp:  97.7 F (36.5 C)  97.7 F (36.5 C)  TempSrc:  Oral  Oral  Resp:  20  19  Height:      Weight:    223 lb 12.3 oz (101.5 kg)  SpO2:  93% 89% 90%   Weight change: -4 lb 5.3 oz (-1.965 kg)  Intake/Output Summary (Last 24 hours) at 12/30/13 0817 Last data filed at 12/30/13 0400  Gross per 24 hour  Intake 327.64 ml  Output   2450 ml  Net -2122.36 ml   Net I/O  3.5 L negative  General: Alert, awake, oriented x3, in no acute distress Neck:  JVP is normal Heart: Irregular rate and rhythm, without murmurs, rubs, gallops.  Lungs: Clear to auscultation.  No rales or wheezes. Exemities:  1+ edema.   Neuro: Grossly intact, nonfocal.   Lab Results: Results for orders placed or performed during the hospital encounter of 12/28/13 (from the past 24 hour(s))  Heparin level (unfractionated)     Status: None   Collection Time: 12/29/13  2:23 PM  Result Value Ref Range   Heparin Unfractionated 0.42 0.30 - 0.70 IU/mL  T3, free     Status: Abnormal   Collection Time: 12/29/13  2:23 PM  Result Value Ref Range   T3, Free 2.2 (L) 2.3 - 4.2 pg/mL  T4, free     Status: None   Collection Time: 12/29/13  2:23 PM  Result Value Ref Range   Free T4 0.93 0.80 - 1.80 ng/dL  Basic metabolic panel     Status: Abnormal   Collection Time: 12/30/13  5:20 AM  Result Value Ref Range   Sodium 134 (L) 137 - 147 mEq/L   Potassium 3.8 3.7 - 5.3 mEq/L   Chloride 94 (L) 96 - 112 mEq/L   CO2 24 19 - 32 mEq/L   Glucose, Bld 114 (H) 70 - 99 mg/dL   BUN 20 6 - 23 mg/dL   Creatinine, Ser 1.610.88 0.50 - 1.35 mg/dL   Calcium 8.4 8.4 - 09.610.5 mg/dL   GFR calc non Af Amer 85 (L) >90 mL/min   GFR calc Af Amer >90 >90 mL/min   Anion gap  16 (H) 5 - 15  Heparin level (unfractionated)     Status: None   Collection Time: 12/30/13  5:20 AM  Result Value Ref Range   Heparin Unfractionated 0.52 0.30 - 0.70 IU/mL  CBC     Status: None   Collection Time: 12/30/13  5:20 AM  Result Value Ref Range   WBC 8.7 4.0 - 10.5 K/uL   RBC 4.78 4.22 - 5.81 MIL/uL   Hemoglobin 15.5 13.0 - 17.0 g/dL   HCT 04.545.7 40.939.0 - 81.152.0 %   MCV 95.6 78.0 - 100.0 fL   MCH 32.4 26.0 - 34.0 pg   MCHC 33.9 30.0 - 36.0 g/dL   RDW 91.414.2 78.211.5 - 95.615.5 %   Platelets 197 150 - 400 K/uL    Studies/Results: No results found.  Medications:  Reviewed   @PROBHOSP @  I have spoken to patinet again about his medical condition  He is still in afib  Rates are better   He still has volume  overload  Heart cath that was sched for today was to eval why heart pumping is down He also has at least moderate aortic stenosis.   He wants to go home  Says he is on a limited salary  Asks what he absolutely needs from medicnes I told him it is imperative that he follow up in clinic to have kidney function checked and blood counts checked  Otherwise I cannot  Prescribe medicines for afib or CHF (lasix, anticoagulatn)  He thought about this and said he would   I told him that he is still on IV Rx and still with volume overload on exam   Switching to po meds would be difficult  He was at risk for SOB and rapid afib worsening quickly  If he leaves now it is AMA  He said he undeerstands  Wants breakfast.   WIll cx cath  Give him 1 00week of medicines  Have appt next week to see how he is doing   WIll have SW come to see patient to discuss payment options.  He was informed that by signing AMA current admit may not be covered  1  Acute on chronic systolic CHF  Still with volume overload  Will switch to po lasix  F/U next wk   No ACE I as I am not sure will take  2.  Afib  Rte control  Give anticoag x 1 wk  More Rx to be determined by f/u  3.  Aortic stenosis.  Should have follow  up    LOS: 2 days   Samuel [name] 12/30/2013, 8:17 AM

## 2013-12-30 NOTE — Evaluation (Signed)
Physical Therapy Evaluation and Discharge Patient Details Name: Samuel [name] PlanRoger T Louissaint MRN: [id number] DOB: 08-02-1945 Today's Date: 12/30/2013   History of Present Illness  Pt is a 68 year old male with history of afib and CHF.   Clinical Impression  Pt is reported to be functioning at baseline for mobility. Pt able to perform bed mobility, transfers, ambulation and stairs without physical assistance. Pt is a little unsteady with ambulation but was able to self correct. Pt reported that he uses a cane at baseline. Pt reported that he was anxious to go home and get his dog. Pt's HR preambulation was 95-105 and increased to 110-115 throughout ambulation. Pt's HR decreased to 105 quickly after sitting back EOB. Pt was provided a handout for ambulation and exercise for individuals with CHF. PT reviewed the sheet with pt and he stated he would use it at discharge.  Pt will not require further acute PT and does not need PT at discharge. No goals will be written.     Follow Up Recommendations No PT follow up    Equipment Recommendations  None recommended by PT    Recommendations for Other Services       Precautions / Restrictions Precautions Precautions: Fall Restrictions Weight Bearing Restrictions: No      Mobility  Bed Mobility Overal bed mobility: Modified Independent             General bed mobility comments: Pt able to sit up on EOB from supine without physical assistance or cuing from PT. HOB was elevated.   Transfers Overall transfer level: Modified independent Equipment used: None             General transfer comment: Pt able sit transfer to standing from lowered bed without physical assistance or cuing from PT. Pt stable once in standing.   Ambulation/Gait Ambulation/Gait assistance: Min guard Ambulation Distance (Feet): 200 Feet Assistive device: None Gait Pattern/deviations: Step-through pattern;Decreased stride length   Gait velocity interpretation: Below normal  speed for age/gender General Gait Details: Pt able to ambulate in hallways without AD and min guard for safety. Pt had instances of unsteadiness and staggering but was able to self-correct. Pt self reported that he was walking at his baseline. Pt reported no symptoms during ambualtion. Pt HR between 110-115 with ambulation and came back down to low 100s when returned to sitting.   Stairs Stairs: Yes Stairs assistance: Min guard Stair Management: One rail Left;Step to pattern;Forwards Number of Stairs: 4 General stair comments: Pt able to negotiate 4 stairs using one railing without cuing or assistance from PT. Pt min guard for safety. Pt was steady and safe with stairs.   Wheelchair Mobility    Modified Rankin (Stroke Patients Only)       Balance Overall balance assessment: Needs assistance Sitting-balance support: Feet supported;No upper extremity supported Sitting balance-Leahy Scale: Good     Standing balance support: During functional activity;No upper extremity supported Standing balance-Leahy Scale: Fair Standing balance comment: Pt is safe standing statically without support but is a little unsteady with ambualtion without support.                              Pertinent Vitals/Pain      Home Living Family/patient expects to be discharged to:: Private residence Living Arrangements: Alone Available Help at Discharge: Family (Intermittently) Type of Home: House Home Access: Stairs to enter Entrance Stairs-Rails: Right;Left;Can reach both Entrance Stairs-Number of Steps: 6 Home  Layout: Two level Home Equipment: Cane - single point      Prior Function Level of Independence: Independent               Hand Dominance        Extremity/Trunk Assessment               Lower Extremity Assessment: Overall WFL for tasks assessed         Communication   Communication: No difficulties  Cognition Arousal/Alertness: Awake/alert Behavior During  Therapy: WFL for tasks assessed/performed Overall Cognitive Status: Within Functional Limits for tasks assessed                      General Comments General comments (skin integrity, edema, etc.): Pt continued to state displeasure about his [facility]. Pt repeatedly stated that he wanted to go home. PA came in and discussed leaving AMA at end of session.     Exercises        Assessment/Plan    PT Assessment Patent does not need any further PT services  PT Diagnosis Difficulty walking   PT Problem List    PT Treatment Interventions     PT Goals (Current goals can be found in the Care Plan section)      Frequency     Barriers to discharge        Co-evaluation               End of Session Equipment Utilized During Treatment: Gait belt Activity Tolerance: Patient tolerated treatment well Patient left: in bed;with call bell/phone within reach           Time: 1011-1035 PT Time Calculation (min) (ACUTE ONLY): 24 min   Charges:   PT Evaluation $Initial PT Evaluation Tier I: 1 Procedure PT Treatments $Gait Training: 8-22 mins   PT G CodesYork Spaniel:          Hebert, Ireoluwa Gorsline SPT 12/30/2013, 12:45 PM  York SpanielOlivia Hebert, SPT  Acute Rehabilitation [phone]

## 2014-01-02 SURGERY — Surgical Case
Anesthesia: *Unknown

## 2014-01-03 ENCOUNTER — Encounter: Payer: Self-pay | Admitting: Physician Assistant

## 2014-01-03 ENCOUNTER — Ambulatory Visit (INDEPENDENT_AMBULATORY_CARE_PROVIDER_SITE_OTHER): Payer: Self-pay | Admitting: Cardiovascular Disease

## 2014-01-03 VITALS — BP 114/86 | HR 116 | Ht 69.0 in | Wt 230.0 lb

## 2014-01-03 DIAGNOSIS — I4891 Unspecified atrial fibrillation: Secondary | ICD-10-CM

## 2014-01-03 DIAGNOSIS — I509 Heart failure, unspecified: Secondary | ICD-10-CM

## 2014-01-03 LAB — BASIC METABOLIC PANEL
BUN: 15 mg/dL (ref 6–23)
CALCIUM: 8.9 mg/dL (ref 8.4–10.5)
CO2: 30 mEq/L (ref 19–32)
Chloride: 99 mEq/L (ref 96–112)
Creatinine, Ser: 0.9 mg/dL (ref 0.4–1.5)
GFR: 85.32 mL/min (ref >60.00)
GLUCOSE: 131 mg/dL — AB (ref 70–99)
Potassium: 4.3 mEq/L (ref 3.5–5.1)
Sodium: 138 mEq/L (ref 135–145)

## 2014-01-03 MED ORDER — FUROSEMIDE 40 MG PO TABS: 40.0000 mg | ORAL_TABLET | Freq: Two times a day (BID) | ORAL | Status: DC

## 2014-01-03 MED ORDER — POTASSIUM CHLORIDE CRYS ER 20 MEQ PO TBCR: 20.0000 meq | EXTENDED_RELEASE_TABLET | Freq: Every day | ORAL | Status: DC

## 2014-01-03 NOTE — Patient Instructions (Addendum)
Your physician has recommended you make the following change in your medication:    START TAKIING  LASIX 40 MG TWICE A DAY   START TAKING KDUR  20 MEQ  ONCE A DAY   Your physician recommends that you return for lab work in:  TODAY BMET    Your physician recommends that you schedule a follow-up appointment in:  WITH KATHRYN THOMPSON  PA NEXT WEEK   01/10/14      For your  leg edema you  should do  the following 1. Leg elevation - I recommend the Lounge Dr. Leg rest.  See below for details  2. Salt restriction  -  Use potassium chloride instead of regular salt as a salt substitute. 3. Walk regularly 4. Compression hose - [facility] [employer] 5. Weight loss     Go to [email or web address]

## 2014-01-03 NOTE — Progress Notes (Signed)
Cardiology Office Note    Date:  01/03/2014   ID:  Fuller PlanRoger T Beidler, DOB [birth year], MRN [id number]  PCP:  Kelle DartingWEAVER, LAYNE C, NP  Cardiologist: Dr. Katrinka BlazingSmith   History of Present Illness: Fuller PlanRoger T Debrosse is a 68 y.o. male with a history of HTN, chronic systolic CHF, atrial fibrillation and medical non compliance who [facility] with afib with RVR and massive volume overload. He was placed on heparin gtt, IV Lasix and rate control but left AMA before his work up and treatment were complete.  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 He saw [place] primary care back in March with complaints of dyspnea. He was noted to be in AF. He was advised to go to the hospital for admission but he declined (no one to care for his dog). He started medical therapy and was improved on follow up a couple of weeks later. He declined further evaluation from cardiology. He was seen again in May, still a little SOB. Again cardiology evaluation was recommended. He then presented to the ER on 12/28/13 with complaints of increasing dyspnea as well as intermittent chest pain and LE edema. He had been out of Lopressor for about a month.  He was admitted last week due to gross volume overload and afib with RVR. He was placed on IV Lasix, heparin gtt and metoprolol for rate control. He had an ECHO that revealed a newly reduced EF of 30-35% and akinesis of the basal-midinferior myocardium. Cath was recommended to rule out CAD. However, the patient refused and left AMA. He was discharged on Lasix 40mg  qd, metoprolol 50mg  BID and Xarelto. He was given a Xarelto card for a free month but was unable to get around to the pharmacy. He has been taking ASA.  He presents today wearing bedroom slippers because that is all he can fit his feet into. He has significant LE edema L>R. He is also unable to sleep at night due to PND and orthopnea. He has dyspnea on exertion and generally does not feel well. He  denies chest pain. He now wishes that he would have proceeded with the heart cath during his hospital stay. He does not want to be re-admitted today and would like to proceed with medical therapy as an outpatient.  Studies:  - Echo (12/29/13): EF 30-35%. Akinesis of the basal-midinferior myocardium. Mod AS. Mean gradient 21mmg HG. Mild- mod MR, mild LA dilation. Mild RA dilation. PA pressure 44mm HG. Mod sized L pleural effusion.    Recent Labs/Images:   Recent Labs  04/19/13 1552  12/28/13 1221 12/28/13 1513 12/29/13 0237 12/30/13 0520  NA 139  < > 134*  --  132* 134*  K 4.4  < > 4.9  --  4.2 3.8  BUN 14  < > 23  --  23 20  CREATININE 1.1  < > 0.97  --  0.98 0.88  ALT 14  --   --   --   --   --   HGB  --   < > 16.0  --  15.0 15.5  TSH  --   --   --  6.010*  --   --   LDLCALC  --   --   --   --  75  --   HDL  --   --   --   --  23*  --   PROBNP 350.0*  < > 6571.0*  --   --   --   < > =  values in this interval not displayed.   Dg Chest Port 1 View  12/28/2013   CLINICAL DATA:  Shortness of breath, chest pain.  EXAM: PORTABLE CHEST - 1 VIEW  COMPARISON:  None.  FINDINGS: The heart size and mediastinal contours are within normal limits. No pneumothorax or pleural effusion is noted. Small calcified granuloma is noted in left upper lobe. Mild right basilar opacity is noted concerning for pneumonia or subsegmental atelectasis. The visualized skeletal structures are unremarkable.  IMPRESSION: Mild right basilar pneumonia or subsegmental atelectasis. Followup radiographs are recommended until resolution.   Electronically Signed   By: Roque Lias M.D.   On: 12/28/2013 13:21     Wt Readings from Last 3 Encounters:  12/30/13 223 lb 12.3 oz (101.5 kg)  06/08/13 231 lb (104.781 kg)  04/27/13 224 lb (101.606 kg)     Past Medical History  Diagnosis Date  . CHF (congestive heart failure)   . Atrial fibrillation     hx of atrial fibrilation    Current Outpatient Prescriptions  Medication  Sig Dispense Refill  . aspirin EC 81 MG EC tablet Take 1 tablet (81 mg total) by mouth daily.    . furosemide (LASIX) 40 MG tablet Take 1 tablet daily. If SOB or LE swelling, add second dose as needed. 7 tablet 0  . metoprolol (LOPRESSOR) 50 MG tablet Take 0.5 tablets (25 mg total) by mouth 2 (two) times daily. 14 tablet 0  . potassium chloride SA (K-DUR,KLOR-CON) 20 MEQ tablet Take 1 tablet (20 mEq total) by mouth daily. (Patient not taking: Reported on 12/28/2013) 30 tablet 1  . rivaroxaban (XARELTO) 20 MG TABS tablet Take 1 tablet (20 mg total) by mouth daily with supper. 7 tablet 0  . triamcinolone cream (KENALOG) 0.1 % Apply 1 application topically 2 (two) times daily. Use sparingly as can cause bleaching and atrophy. (Patient not taking: Reported on 12/28/2013) 30 g 0   No current facility-administered medications for this visit.     Allergies:   Review of patient's allergies indicates no known allergies.   Social History:  The patient  reports that he has been smoking Pipe.  He has never used smokeless tobacco. He reports that he does not drink alcohol or use illicit drugs.   Family History:  The patient's family history includes Alcohol abuse in his father; Arrhythmia in his father; Heart disease in his father.   ROS:  Please see the history of present illness.  All other systems reviewed and negative.    PHYSICAL EXAM: VS:  There were no vitals taken for this visit. Well nourished, well developed, in no acute distress HEENT: normal Neck: +JVD Cardiac:  normal S1, S2; irreg irreg, tachy, +SEM  Lungs:  clear to auscultation bilaterally, Expiratory wheezing Abd: soft, nontender, no hepatomegaly Ext: 2+ pitting edema to knees L>R Skin: warm and dry Neuro:  CNs 2-12 intact, no focal abnormalities noted  EKG:  afib with RVR HR 116. RAD     ASSESSMENT AND PLAN:  DARRY KELNHOFER is a 68 y.o. male with a history of HTN, chronic systolic CHF, atrial fibrillation and medical non  compliance who presented to [facility] on 12/28/13 with afib with RVR and massive volume overload. He was placed on heparin gtt, IV Lasix and rate control but left AMA before his work up and treatment were complete.  Acute on chronic systolic CHF- 2D ECHO with newly reduced EF (30-35%) and akinesis of basal-midinferior myocardium. This may be tachycardia mediated but  LHC was recommended with newly reduced EF to rule out ischemia. He refused this because he needed to go home to take care of his dog and didn't want "exploratory" surgery; however, now he is thinking that he may want this done. -- He was sent home on 40mg  po lasix. Still with volume overload, orthopnea and PND . His creat was normal at discharge. Will start Lasix 40mg  BID and add 20MEq K dur.  -- Will check a BMET today. -- No ACE due to non-complaince. Continue BB -- He was instructed to elevate his legs, restrict fluid and avoid salt.   Atrial fibrillation- He is still in afib. Rates are better. HR 116 bpm.   -- He has a free month of Xarelto that he hasn't bothered to get filled. He said he will look into this today. -- Continue Metoprolol 50mg  BID -- Coumadin originally not an option with non compliance and no transportation for INR appointments. However, patient is now warming up to the idea and is willing to discuss this in a week at follow up appointment.  Aortic stenosis- 2D ECHO with moderate stenosis. Peak velocity (S): 331cm/s. Mean gradient (S): 21 mm Hg. -- Continue to monitor  HTN- BP well controlled today. 114/86.  -- Continue Metoprolol 50mg  BID  Disposition:   FU with me in 1 week. If he is evolemic an outpatient cath can be arranged. The patient may be open to starting coumadin after heart cath.   Signed, Cline CrockKathryn Thompson, PA-C, MHS 01/03/2014 8:46 AM    [facility] [address], [place], [place]  [address] Phone: [phone]; Fax: [phone]   Attending Note:   The patient was  seen and examined.  Agree with assessment and plan as noted above.  Changes made to the above note as needed.  Pt was seen and examined with Carlean JewsKatie Thompson, PA. He has a complex medical hx.  He was recently seen at the hospital for A-Fib , CHF symptoms.  He left AMA before the work up coould be completed. He needs to have a cath but at this point, he cannot lie flat and so we will increase his Lasix to BID.  He needs to avoid salt. He should be on anticoagulant - he cannot affort NOACs long term but maybe we can use samples for the next months or so prior to his cath. Return to see the PA or his primary cardiologist next week to see if he is ready for cath.       Vesta MixerPhilip J. Nahser, Montez HagemanJr., MD, [facility] 01/03/2014, 5:26 PM [address],  [address] Office [phone] Pager [phone]

## 2014-01-10 ENCOUNTER — Telehealth: Payer: Self-pay | Admitting: *Deleted

## 2014-01-10 NOTE — Telephone Encounter (Signed)
Patient left vm requesting to speak to Layne. Patient would like a call back.

## 2014-01-11 NOTE — Telephone Encounter (Signed)
Returned patient's call. Patient stated that he wanted to touch base with Layne after his stay in the hospital.

## 2014-01-11 NOTE — Telephone Encounter (Signed)
Pt feeling better-breathing better w/lasix bid. Explained importance of xarelto & taking potassium whith increased lasix-he has not been taking either. Potassium is hard to swallow-he is breaking it up-says it burns. Advised not to break up, dissolve in water instead. Pt voices understanding. Says he will start both meds. Did not realize he needs to f/u w/cardiology-advised last OV note says he is to f/u in 1 week. He will call cardio ofc to schedule appt.

## 2014-01-30 ENCOUNTER — Telehealth: Payer: Self-pay | Admitting: Nurse Practitioner

## 2014-01-30 NOTE — Telephone Encounter (Signed)
He must call cardiology ofc about this med. He does not have to have blood checked every 2 weeks. Cardiology ofc will likely give him samples.

## 2014-01-30 NOTE — Telephone Encounter (Signed)
Please advise 

## 2014-01-30 NOTE — Telephone Encounter (Signed)
Patient says Xarelto Rx costs $400/month. He received a sample which he is finished with. Is there something else patient can take that he won't have to have his blood checked every 2 weeks?

## 2014-01-31 ENCOUNTER — Other Ambulatory Visit: Payer: Self-pay | Admitting: *Deleted

## 2014-01-31 NOTE — Telephone Encounter (Signed)
Please advise on refill. I see that the patient was to return in one week from his last ov to discuss a cath, but he did not follow up. Not sure if he can get a full rx or just partial. Thanks, MI

## 2014-01-31 NOTE — Telephone Encounter (Signed)
LMOM for pt to CB.  

## 2014-01-31 NOTE — Telephone Encounter (Signed)
Patient states that he got an xray while at hospital and was never given the results.  Please advise.

## 2014-02-01 ENCOUNTER — Telehealth: Payer: Self-pay | Admitting: Cardiovascular Disease

## 2014-02-01 ENCOUNTER — Other Ambulatory Visit: Payer: Self-pay | Admitting: Nurse Practitioner

## 2014-02-01 ENCOUNTER — Other Ambulatory Visit: Payer: Self-pay | Admitting: *Deleted

## 2014-02-01 MED ORDER — RIVAROXABAN 20 MG PO TABS: 20.0000 mg | ORAL_TABLET | Freq: Every day | ORAL | Status: DC

## 2014-02-01 NOTE — Telephone Encounter (Addendum)
Pt would like samples of Xarelto 20mg  as he has used the 30 day refill card and has 2 days left and needs more.  Instructed pt I will leave samples at the front desk.  He said he can't afford this medication and wants to know his options of other blood thinners.  Pt was educated that he needs to schedule an appointment to discuss with a provider.  Pt does not drive very far and our office is to far for him to comfortably drive. He said he has used a transportation company before but does not want to call them.  Asked does he haves to call he said maybe.  Pt stated he would just stop taking the medication and was educated on the risk of clots and/or a stroke, said "I will just take my chances".  Asked if I could get him an appointment before the end of the week and he can pick up samples at same time could he make arrangements, he stated he would try.   Return call to pt and left message that could get him an appointment for tomorrow with PA.

## 2014-02-01 NOTE — Telephone Encounter (Signed)
pls call pt: Advise CXR in hosiptal showed decreased aeration in R lung-this could have been fluid from heart failure or pneumonia. He needs another CXR to see if it cleared. [facility]?

## 2014-02-01 NOTE — Telephone Encounter (Signed)
LMOVM for pt to return call 

## 2014-02-01 NOTE — Telephone Encounter (Signed)
New Message  Pt called asking about Xeralto samples, he was referred to the refill dept and was told that he would need to set up an appt to get more. Instead, he wanted to speak with the Rn about how to continue.  Please call back to discuss.

## 2014-02-02 ENCOUNTER — Telehealth: Payer: Self-pay

## 2014-02-02 MED ORDER — FUROSEMIDE 40 MG PO TABS: 40.0000 mg | ORAL_TABLET | Freq: Two times a day (BID) | ORAL | Status: DC

## 2014-02-02 NOTE — Telephone Encounter (Signed)
Received call from patient - Dr. Elease HashimotoNahser saw this patient last week as DOD with Deborha PaymentKatie Stern, PA   Spoke with patient and advised him of Dr. Harvie BridgeNahser's advice to remain on a blood thinner and asked how we could help make certain that he remains on the medication.  Patient states he is also concerned about being out of his Furosemide.  I advised patient that I will refill Furosemide.  Patient verbalized frustration over the price of Xarelto; advised patient that our office cannot control the amount of Xarelto samples we have in the office but we will try to help him when we can.  I advised patient to call the drug company to ask about assistance with cost of Xarelto.  Patient verbalized understanding and agreement and thanked me for the call.  I am routing message to Dr. Katrinka BlazingSmith and his primary CMA, Waldron LabsLisa Parris-Godley, CMA for their review.

## 2014-02-02 NOTE — Telephone Encounter (Signed)
I dont have any other suggestions. He needs to be on a blood thinner Coumadin is not a good option given his transportation challenges. He needs to try to come in to the office to get samples and / or new script.

## 2014-02-02 NOTE — Telephone Encounter (Signed)
Pt is requesting a ref[facility] on his [facility]. He also states he is going to stop the Xarelto and try to get by without ir for a while. He cannot afford 400 dollars a month. He states he wants to double up on his Aspirin to get his blood thin. He states Layne knows he is hard-headed and he feels this is what he has to do. FYI

## 2014-02-06 ENCOUNTER — Telehealth: Payer: Self-pay | Admitting: Nurse Practitioner

## 2014-02-06 NOTE — Telephone Encounter (Signed)
Will ref to Hospice for management Of CHF Dr Tenny Crawoss agrees that pt will not survive 6 mos without treatment. Given his financial & transportation limitations, his care remains fragmented.

## 2014-02-07 ENCOUNTER — Other Ambulatory Visit: Payer: Self-pay | Admitting: Nurse Practitioner

## 2014-02-07 NOTE — Telephone Encounter (Signed)
pls advise [name]. That he must call cardiology to get meds refilled. And he was supposed to have a follow up visit with cardiology 3 weeks ago.  Also, tell him I spoke with Hospice today, and he may not qualify for services. So he needs to plan on following up with cardiology.

## 2014-02-13 NOTE — Telephone Encounter (Signed)
Layne spoke to patient.

## 2014-02-13 NOTE — Telephone Encounter (Signed)
Patient is aware 

## 2014-02-20 ENCOUNTER — Telehealth: Payer: Self-pay | Admitting: Nurse Practitioner

## 2014-02-20 NOTE — Telephone Encounter (Signed)
pls call pt: Advise Hospice denied services. He needs to call cardiology and set up follow up apt w/Dr Verdis PrimeHenry [name].

## 2014-02-21 NOTE — Telephone Encounter (Signed)
LMOVM for pt to return call 

## 2014-03-03 ENCOUNTER — Telehealth: Payer: Self-pay | Admitting: *Deleted

## 2014-03-03 NOTE — Telephone Encounter (Signed)
Patient returned call and was given information.

## 2014-03-03 NOTE — Telephone Encounter (Signed)
LMOVM for call back. 2nd call

## 2014-03-03 NOTE — Telephone Encounter (Signed)
Patient wanted to know what you would recommend to help him sleep. Patient stated that he has been taking Aleve pm, however he has to take it around 1 or 2 in the morning or he will wake up during the night. Please advise?

## 2014-03-06 NOTE — Telephone Encounter (Signed)
LMOVM for pt to return call 

## 2014-03-06 NOTE — Telephone Encounter (Signed)
He needs to be seen in office. He has too many medical problems to prescribe sleep med without seeing him.

## 2014-03-07 NOTE — Telephone Encounter (Signed)
Patient notified. Patient is going to schedule [facility].

## 2014-03-09 ENCOUNTER — Telehealth: Payer: Self-pay | Admitting: Nurse Practitioner

## 2014-03-09 ENCOUNTER — Other Ambulatory Visit: Payer: Self-pay | Admitting: Nurse Practitioner

## 2014-03-09 DIAGNOSIS — I509 Heart failure, unspecified: Secondary | ICD-10-CM

## 2014-03-09 MED ORDER — METOPROLOL TARTRATE 25 MG PO TABS: 25.0000 mg | ORAL_TABLET | Freq: Two times a day (BID) | ORAL | Status: DC

## 2014-03-09 NOTE — Telephone Encounter (Signed)
Please advise refill for metoprolol-$4 list?

## 2014-03-09 NOTE — Progress Notes (Signed)
Patient notified

## 2014-03-09 NOTE — Telephone Encounter (Signed)
Patient is requesting refill for a generic(he thinks it's the short acting one) of Metoprolol since it will be $4 instead of $40 [facility] [address]

## 2014-07-20 NOTE — Telephone Encounter (Signed)
I will close this encounter.  The pt was seen in follow-up 01/03/14.

## 2014-08-02 ENCOUNTER — Other Ambulatory Visit: Payer: Self-pay | Admitting: Family Medicine

## 2014-08-02 MED ORDER — FUROSEMIDE 40 MG PO TABS: 40.0000 mg | ORAL_TABLET | Freq: Two times a day (BID) | ORAL | Status: DC

## 2014-08-02 NOTE — Telephone Encounter (Signed)
Pt said thank you so much and he is going to miss you very much!  He wishes you well!

## 2014-08-03 ENCOUNTER — Other Ambulatory Visit: Payer: Self-pay | Admitting: Family Medicine

## 2014-08-03 DIAGNOSIS — I509 Heart failure, unspecified: Secondary | ICD-10-CM

## 2014-08-03 MED ORDER — METOPROLOL TARTRATE 25 MG PO TABS: 25.0000 mg | ORAL_TABLET | Freq: Two times a day (BID) | ORAL | Status: DC

## 2014-10-23 ENCOUNTER — Telehealth: Payer: Self-pay | Admitting: Nurse Practitioner

## 2014-10-24 NOTE — Telephone Encounter (Signed)
Please advise. Thanks.  

## 2014-10-24 NOTE — Telephone Encounter (Signed)
Pt is requesting a letter to get out of jury duty. He states he would not be able to walk through the court house to get where he needs to be. He feels he is physically limited to make that trek.  Please contact him when/if letter is ready.

## 2014-10-25 ENCOUNTER — Encounter: Payer: Self-pay | Admitting: Family Medicine

## 2014-10-25 NOTE — Telephone Encounter (Signed)
OK.  Letter printed. 

## 2014-10-25 NOTE — Telephone Encounter (Signed)
Left message stating letter is ready for p/u.

## 2015-09-21 ENCOUNTER — Other Ambulatory Visit: Payer: Self-pay | Admitting: Nurse Practitioner

## 2015-09-21 MED ORDER — FUROSEMIDE 20 MG PO TABS: 20.0000 mg | ORAL_TABLET | Freq: Every day | ORAL | 11 refills | Status: DC

## 2015-11-05 ENCOUNTER — Encounter: Payer: Self-pay | Admitting: Cardiovascular Disease

## 2015-11-05 ENCOUNTER — Telehealth: Payer: Self-pay | Admitting: Cardiovascular Disease

## 2015-11-05 MED ORDER — FUROSEMIDE 20 MG PO TABS: 40.0000 mg | ORAL_TABLET | Freq: Two times a day (BID) | ORAL | 11 refills | Status: DC

## 2015-11-05 NOTE — Telephone Encounter (Signed)
Spoke with pt who is obviously SOB.  He reports he does have some edema at feet and ankles but feels as though it is mainly around his chest.  He reports having trouble walking due to increased SOB. He is able to sleep on 1 pillow at night, has not been weighing daily and doesn't know HR or BP.  He does have a history of Chronic systolic CHF and At Fib.  He reports in the past he was on Furosemide 40 mg BID and he feels like he should be on that dose now.  He is upset that his last RX was written for 20 mg a day and he was only given #30 at a time.  He also reports that he has only been taking 1 tablet QOD recently "trying to stretch it out further."  I advised I will forward information to Dr Elease HashimotoNahser for review and will call back with any medication changes/orders.  He has an appt scheduled with Dr Elease HashimotoNahser 10/20.

## 2015-11-05 NOTE — Telephone Encounter (Signed)
Spoke with patient and reviewed Dr. Harvie BridgeNahser's advice to increase furosemide to 40 mg twice daily.  He states he knows that this dose will help him significantly.  I advised that he needs a sooner appointment than 10/20 since he has not been seen since 2015.  He does not want to come in sooner.  I advised him that lab work is needed to check his kidney function and that he needs more frequent appointments to monitor CHF.  He states it is difficult for him to get into the office and he needs an appointment for afternoon because he sleeps until 12 pm. He reluctantly agreed to let me move his appointment to Thursday 10/12 at 3:30 with Nada BoozerLaura Ingold, NP.  He verbalized understanding and agreement with plan.

## 2015-11-05 NOTE — Telephone Encounter (Signed)
Follow up  ° ° °Patient returning call back to nurse  °

## 2015-11-05 NOTE — Telephone Encounter (Signed)
He needs to be seen tomorrow by one of our PAs. Start Lasix 40 mg twice a day. He'll need a basic metabolic profile and office visit tomorrow.  I had not seen him in almost 2 years. He will probably need to be seen more often

## 2015-11-05 NOTE — Telephone Encounter (Signed)
Pt c/o Shortness Of Breath: STAT if SOB developed within the last 24 hours or pt is noticeably SOB on the phone  1. Are you currently SOB (can you hear that pt is SOB on the phone)? Yes   2. How long have you been experiencing SOB?at least a week or two , its getting worse   3. Are you SOB when sitting or when up moving around? Moving around   4. Are you currently experiencing any other symptoms? No

## 2015-11-06 NOTE — Telephone Encounter (Signed)
Agree with note from Express ScriptsChelle Swinyer, RN. We need to see  Him as soon as possible for labs and evaluation We cannot just continue medications without proper monitoring of the patient and labs.

## 2015-11-08 ENCOUNTER — Ambulatory Visit: Payer: Medicare Other | Admitting: Cardiology

## 2015-11-14 ENCOUNTER — Ambulatory Visit (INDEPENDENT_AMBULATORY_CARE_PROVIDER_SITE_OTHER): Payer: Self-pay | Admitting: Cardiovascular Disease

## 2015-11-14 ENCOUNTER — Encounter: Payer: Self-pay | Admitting: Cardiovascular Disease

## 2015-11-14 ENCOUNTER — Ambulatory Visit: Payer: Medicare Other | Admitting: Cardiovascular Disease

## 2015-11-14 VITALS — BP 116/86 | HR 166 | Ht 69.0 in | Wt 244.8 lb

## 2015-11-14 DIAGNOSIS — Z13220 Encounter for screening for lipoid disorders: Secondary | ICD-10-CM

## 2015-11-14 DIAGNOSIS — I5022 Chronic systolic (congestive) heart failure: Secondary | ICD-10-CM

## 2015-11-14 DIAGNOSIS — I509 Heart failure, unspecified: Secondary | ICD-10-CM

## 2015-11-14 DIAGNOSIS — I4891 Unspecified atrial fibrillation: Secondary | ICD-10-CM

## 2015-11-14 MED ORDER — METOPROLOL TARTRATE 25 MG PO TABS: 25.0000 mg | ORAL_TABLET | Freq: Two times a day (BID) | ORAL | 3 refills | Status: DC

## 2015-11-14 MED ORDER — POTASSIUM CHLORIDE CRYS ER 20 MEQ PO TBCR: 20.0000 meq | EXTENDED_RELEASE_TABLET | Freq: Every day | ORAL | 3 refills | Status: DC

## 2015-11-14 MED ORDER — RIVAROXABAN 20 MG PO TABS: 20.0000 mg | ORAL_TABLET | Freq: Every day | ORAL | 11 refills | Status: DC

## 2015-11-14 MED ORDER — FUROSEMIDE 40 MG PO TABS: 40.0000 mg | ORAL_TABLET | Freq: Every day | ORAL | 3 refills | Status: DC

## 2015-11-14 NOTE — Progress Notes (Signed)
Cardiology Office Note    Date:  11/14/2015   ID:  WENDELL NICOSON, DOB 10/26/1945, MRN [id number]  PCP:  No PCP Per Patient  Cardiologist: Dr. Elease Hashimoto   Notes from Dec. 2015:  Samuel [name] is a 70 y.o. male with a history of HTN, chronic systolic CHF, atrial fibrillation and medical non compliance who presented to [facility] on 12/28/13 with afib with RVR and massive volume overload. He was placed on heparin gtt, IV Lasix and rate control but left AMA before his work up and treatment were complete.  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 He saw [facility] back in March with complaints of dyspnea. He was noted to be in AF. He was advised to go to the hospital for admission but he declined (no one to care for his dog). He started medical therapy and was improved on follow up a couple of weeks later. He declined further evaluation from cardiology. He was seen again in May, still a little SOB. Again cardiology evaluation was recommended. He then presented to the ER on 12/28/13 with complaints of increasing dyspnea as well as intermittent chest pain and LE edema. He had been out of Lopressor for about a month.  He was admitted last week due to gross volume overload and afib with RVR. He was placed on IV Lasix, heparin gtt and metoprolol for rate control. He had an ECHO that revealed a newly reduced EF of 30-35% and akinesis of the basal-midinferior myocardium. Cath was recommended to rule out CAD. However, the patient refused and left AMA. He was discharged on Lasix 40mg  qd, metoprolol 50mg  BID and Xarelto.   Studies:  - Echo (12/29/13): EF 30-35%. Akinesis of the basal-midinferior myocardium. Mod AS. Mean gradient HG. Mild- mod MR, mild LA dilation. Mild RA dilation. PA pressure 44mm HG. Mod sized L pleural effusion.   Oct. 18, 2017:  Samuel [name] is seen back today after a 2 year absence Seen with wife , Samuel [name]. Here because he ran out of meds -  He is  taking lasix 20 mg a day  Takes ASA when he remembers  No longer Taking metoprolol, potassium, Xarelto Is not on any other meds.   Has some shortness of breath,  Has DOE -   Especially for the past month  - since his lasix was cut back .    Recent Labs/Images:  No results for input(s): NA, K, BUN, CREATININE, ALT, HGB, TSH, LDLCALC, LDLDIRECT, HDL, BNP, PROBNP in the last 8760 hours.  Invalid input(s): LDL   Dg Chest Port 1 View  12/28/2013   CLINICAL DATA:  Shortness of breath, chest pain.  EXAM: PORTABLE CHEST - 1 VIEW  COMPARISON:  None.  FINDINGS: The heart size and mediastinal contours are within normal limits. No pneumothorax or pleural effusion is noted. Small calcified granuloma is noted in left upper lobe. Mild right basilar opacity is noted concerning for pneumonia or subsegmental atelectasis. The visualized skeletal structures are unremarkable.  IMPRESSION: Mild right basilar pneumonia or subsegmental atelectasis. Followup radiographs are recommended until resolution.   Electronically Signed   By: Roque Lias M.D.   On: 12/28/2013 13:21     Wt Readings from Last 3 Encounters:  11/14/15 244 lb 12.8 oz (111 kg)  01/03/14 230 lb (104.3 kg)  12/30/13 223 lb 12.3 oz (101.5 kg)     Past Medical History:  Diagnosis Date  . Atrial fibrillation (HCC)  hx of atrial fibrilation  . CHF (congestive heart failure) (HCC)     Current Outpatient Prescriptions  Medication Sig Dispense Refill  . aspirin EC 81 MG EC tablet Take 1 tablet (81 mg total) by mouth daily.    . furosemide (LASIX) 20 MG tablet Take 2 tablets (40 mg total) by mouth 2 (two) times daily. 60 tablet 11  . metoprolol tartrate (LOPRESSOR) 25 MG tablet Take 1 tablet (25 mg total) by mouth 2 (two) times daily. (Patient not taking: Reported on 11/14/2015) 60 tablet 3  . potassium chloride SA (K-DUR,KLOR-CON) 20 MEQ tablet Take 1 tablet (20 mEq total) by mouth daily. (Patient not taking: Reported on 11/14/2015) 30 tablet  3  . rivaroxaban (XARELTO) 20 MG TABS tablet Take 1 tablet (20 mg total) by mouth daily. (Patient not taking: Reported on 11/14/2015) 15 tablet 0   No current facility-administered medications for this visit.      Allergies:   Review of patient's allergies indicates no known allergies.   Social History:  The patient  reports that he has been smoking Pipe.  He has never used smokeless tobacco. He reports that he does not drink alcohol or use drugs.  No ETOH   Family History:  The patient's family history includes Alcohol abuse in his father; Arrhythmia in his father; Heart disease in his father.   ROS:  Please see the history of present illness.  All other systems reviewed and negative.    PHYSICAL EXAM: VS:  BP 116/86 (BP Location: Left Arm, Patient Position: Sitting, Cuff Size: Large)   Pulse (!) 166   Ht 5\' 9"  (1.753 m)   Wt 244 lb 12.8 oz (111 kg)   BMI 36.15 kg/m  Well nourished, well developed, in no acute distress  HEENT: normal  Neck: +JVD  Cardiac:   irreg irreg, tachy, +SEM  Lungs:  clear to auscultation bilaterally, Expiratory wheezing Abd: soft, nontender, no hepatomegaly  Ext: 1- 2+ pitting edema to knees L>R  Skin: warm and dry  Neuro:  CNs 2-12 intact, no focal abnormalities noted  EKG:  afib with RVR.   HR 166    ASSESSMENT AND PLAN:  Samuel [name] is a 70 y.o. male with a history of HTN, chronic systolic CHF, atrial fibrillation and medical non compliance who [facility] with afib with RVR and massive volume overload. He was placed on heparin gtt, IV Lasix and rate control but left AMA before his work up and treatment were complete.  Acute on chronic systolic CHF- 2D ECHO with   reduced EF (30-35%) and akinesis of basal-midinferior myocardium.  Will repeat the echo    Atrial fibrillation- He is still in afib.  Rate is fast .  I suspect these had a rapid ventricular rate for the past several years. It would not surprise me if his CHF is  worse. CHADS2 VASC = 3 ( at least)  ( age, CHF HTN   I suggested that we admit him to the hospital but he refused to be admitted. We will start him on metoprolol 25 mg twice a day for rate control. We will add Xarelto 20 mg a day. He will probably need lots of samples because he typically cannot afford the Xarelto and does not have insurance.  I explained the importance of take in the blood thinner.    Aortic stenosis- 2D ECHO with moderate stenosis. Peak velocity (S): 331cm/s. Mean gradient (S): 21 mm Hg. -- Continue to monitor  HTN- BP well controlled today.  We will be starting Lasix and metoprolol for treatment of congestive heart failure and his rapid atrial fibrillation.  We'll have her return in 2 weeks to see one of our physician's assistants and I will  plan on seeing him again in several months.    Kristeen Miss, MD  11/14/2015 6:07 PM    [facility] [address] [place],  [address] [place], [place]  [address] Pager [phone] Phone: [phone]; Fax: [phone]

## 2015-11-14 NOTE — Patient Instructions (Addendum)
Medication Instructions:  START Xarelto 20 mg once daily START Metoprolol 25 mg twice daily START KCl (potassium) 20 meq once daily   Labwork: Your physician recommends that you return for lab work in: this week for BNP, Complete metabolic panel, cholesterol You will need to FAST for this appointment - nothing to eat or drink except water after midnight the night before your lab work   Testing/Procedures: Your physician has requested that you have an echocardiogram. Echocardiography is a painless test that uses sound waves to create images of your heart. It provides your doctor with information about the size and shape of your heart and how well your heart's chambers and valves are working. This procedure takes approximately one hour. There are no restrictions for this procedure.    Follow-Up: Your physician recommends that you schedule a follow-up appointment in: 2 weeks with PA/NP   If you need a refill on your cardiac medications before your next appointment, please call your pharmacy.   Thank you for choosing [facility]! Eligha BridegroomMichelle Victorious Kundinger, RN [phone]

## 2015-11-16 ENCOUNTER — Other Ambulatory Visit: Payer: Medicare Other

## 2015-11-16 ENCOUNTER — Ambulatory Visit: Payer: Medicare Other | Admitting: Cardiovascular Disease

## 2015-11-19 ENCOUNTER — Other Ambulatory Visit: Payer: Self-pay | Admitting: *Deleted

## 2015-11-19 DIAGNOSIS — I4891 Unspecified atrial fibrillation: Secondary | ICD-10-CM

## 2015-11-19 DIAGNOSIS — I5022 Chronic systolic (congestive) heart failure: Secondary | ICD-10-CM

## 2015-11-19 DIAGNOSIS — Z13220 Encounter for screening for lipoid disorders: Secondary | ICD-10-CM

## 2015-11-20 LAB — BRAIN NATRIURETIC PEPTIDE: BRAIN NATRIURETIC PEPTIDE: 334.8 pg/mL — AB (ref <100)

## 2015-11-20 LAB — COMPREHENSIVE METABOLIC PANEL
ALT: 9 U/L (ref 9–46)
AST: 22 U/L (ref 10–35)
Albumin: 4.3 g/dL (ref 3.6–5.1)
Alkaline Phosphatase: 54 U/L (ref 40–115)
BILIRUBIN TOTAL: 1.9 mg/dL — AB (ref 0.2–1.2)
BUN: 13 mg/dL (ref 7–25)
CO2: 23 mmol/L (ref 20–31)
CREATININE: 1.13 mg/dL (ref 0.70–1.18)
Calcium: 9.1 mg/dL (ref 8.6–10.3)
Chloride: 100 mmol/L (ref 98–110)
GLUCOSE: 158 mg/dL — AB (ref 65–99)
Potassium: 3.7 mmol/L (ref 3.5–5.3)
SODIUM: 137 mmol/L (ref 135–146)
Total Protein: 7.5 g/dL (ref 6.1–8.1)

## 2015-11-20 LAB — LIPID PANEL
Cholesterol: 157 mg/dL (ref 125–200)
HDL: 27 mg/dL — ABNORMAL LOW (ref >=40)
LDL CALC: 107 mg/dL (ref <130)
Total CHOL/HDL Ratio: 5.8 Ratio — ABNORMAL HIGH (ref <=5.0)
Triglycerides: 115 mg/dL (ref <150)
VLDL: 23 mg/dL (ref <30)

## 2015-11-21 ENCOUNTER — Telehealth: Payer: Self-pay | Admitting: Cardiovascular Disease

## 2015-11-21 NOTE — Telephone Encounter (Signed)
New Message ° °Pt call requesting to speak with RN about results. Please call back to discuss  °

## 2015-11-21 NOTE — Telephone Encounter (Signed)
Spoke with patient who called to ask about lab results.  I reviewed lab results with him and advised him to continue current medications. I reminded him of his echo appointment on Monday 10/30.  He states he is unable to come due to back pain and will call back to schedule.  He also cancelled his follow-up appointment with Dr. Elease HashimotoNahser on 11/2.  I reminded him that he will need to be compliant with Dr. Harvie BridgeNahser's plan in order to continue to receive his medications (he insisted on past telephone calls that I refill his medications without an appointment when he had not been seen by anyone in our practice for 2 years). He stated "that's extortion."  I advised him that it is not, it is care for him.  He laughed and said he will call back to schedule echo and follow-up appointments.

## 2015-11-26 ENCOUNTER — Other Ambulatory Visit (HOSPITAL_COMMUNITY): Payer: Medicare Other

## 2015-11-26 ENCOUNTER — Ambulatory Visit (HOSPITAL_COMMUNITY): Payer: Medicare Other

## 2015-11-29 ENCOUNTER — Telehealth: Payer: Self-pay | Admitting: Cardiovascular Disease

## 2015-11-29 ENCOUNTER — Ambulatory Visit: Payer: Medicare Other | Admitting: Cardiovascular Disease

## 2015-11-29 DIAGNOSIS — I5022 Chronic systolic (congestive) heart failure: Secondary | ICD-10-CM

## 2015-11-29 DIAGNOSIS — I4891 Unspecified atrial fibrillation: Secondary | ICD-10-CM

## 2015-11-29 NOTE — Telephone Encounter (Signed)
Pt calling regarding wanting generic of Potassium Chloride and Metoprolol, Xarelto too expensive -wants something different if possible-

## 2015-11-29 NOTE — Telephone Encounter (Signed)
Spoke with patient who states he cannot afford Xarelto, Lopressor, or Kdur.  [facility].  I advised that the [facility]  I searched the internet and advised him that there are pharmacies where the kdur is cheaper.  His last blood K+ level was 3.7 and I advised he can decrease dose to Kdur 10 meq daily to save money and/or I can send the Rx to a different pharmacy.  I advised him to increase his dietary intake of potassium-rich foods and he agrees.  He states he has plenty of Kdur pills currently and will call back when he runs out to get Rx sent to a different pharmacy.  He asks if plavix is an alternative to xarelto.  I reviewed the differences with him and advised that alternatives to xarelto are Savaysa, Coumadin, Eliquis or Pradaxa.  I explained that coumadin is the cheapest but requires regular monitoring in our office. I do not think this would be a good alternative for him because he has difficulty getting to appointments.  He would like to know if he can take aspirin or plavix in the place of Xarelto.  I advised I will forward to Dr. Elease HashimotoNahser for advice and will call him back.  He has scheduled his echo for 11/21.

## 2015-11-29 NOTE — Telephone Encounter (Signed)
I agree with the note from Eligha BridegroomMichelle Swinyer, RN. He can cut back on his potassium and will recheck a level in one month to make sure that he's getting enough. He can get extra potassium and his food or buy "No Salt" which is a salt substitute that is primarily potassium chloride.  Plavix is not sufficient to prevent him from strokes with atrial fibrillation. He'll need to take one of the standard anticoagulation medications as noted in Michelle's  note.

## 2015-12-06 NOTE — Telephone Encounter (Signed)
Spoke with patient and reviewed Dr. Harvie BridgeNahser's advice with him regarding anticoagulant.  He states he cannot afford Xarelto, not even 1 month supply.  I advised that I will check to see if samples are available when he comes for echo on 11/21. He states he will have to take aspirin because he cannot get to our office for regular coumadin clinic appointments if he takes warfarin. Also, he has not been compliant with medications or appointments in the past which is a concern.  I advised him regarding No Salt substitute also. I advised him that we will schedule follow-up appointment with Dr. Elease HashimotoNahser based on echo results. He is aware he needs a bmet on 11/21 when he comes for echo. He verbalized understanding and agreement and thanked me for the call.

## 2015-12-18 ENCOUNTER — Other Ambulatory Visit: Payer: Self-pay | Admitting: Nurse Practitioner

## 2015-12-18 ENCOUNTER — Other Ambulatory Visit: Payer: Self-pay | Admitting: *Deleted

## 2015-12-18 ENCOUNTER — Ambulatory Visit (HOSPITAL_COMMUNITY): Payer: Medicare Other | Attending: Cardiovascular Disease

## 2015-12-18 ENCOUNTER — Other Ambulatory Visit: Payer: Self-pay

## 2015-12-18 ENCOUNTER — Ambulatory Visit: Payer: Medicare Other | Admitting: Cardiovascular Disease

## 2015-12-18 DIAGNOSIS — I340 Nonrheumatic mitral (valve) insufficiency: Secondary | ICD-10-CM | POA: Diagnosis not present

## 2015-12-18 DIAGNOSIS — I110 Hypertensive heart disease with heart failure: Secondary | ICD-10-CM | POA: Diagnosis not present

## 2015-12-18 DIAGNOSIS — I5022 Chronic systolic (congestive) heart failure: Secondary | ICD-10-CM | POA: Insufficient documentation

## 2015-12-18 DIAGNOSIS — I351 Nonrheumatic aortic (valve) insufficiency: Secondary | ICD-10-CM | POA: Diagnosis not present

## 2015-12-18 DIAGNOSIS — I4891 Unspecified atrial fibrillation: Secondary | ICD-10-CM | POA: Insufficient documentation

## 2015-12-18 DIAGNOSIS — I509 Heart failure, unspecified: Secondary | ICD-10-CM

## 2015-12-18 LAB — BASIC METABOLIC PANEL
BUN: 10 mg/dL (ref 7–25)
CHLORIDE: 103 mmol/L (ref 98–110)
CO2: 23 mmol/L (ref 20–31)
Calcium: 9.1 mg/dL (ref 8.6–10.3)
Creat: 0.89 mg/dL (ref 0.70–1.18)
GLUCOSE: 95 mg/dL (ref 65–99)
POTASSIUM: 4.6 mmol/L (ref 3.5–5.3)
Sodium: 136 mmol/L (ref 135–146)

## 2015-12-18 MED ORDER — METOPROLOL TARTRATE 50 MG PO TABS
50.0000 mg | ORAL_TABLET | Freq: Two times a day (BID) | ORAL | 3 refills | Status: DC
Start: 2015-12-18 — End: 2015-12-24

## 2015-12-18 NOTE — Telephone Encounter (Signed)
[name] called asking for Xarelto 20 mg samples, After researching [name] chart it was found that he ask for samples due to "Cant afford it" per pt. He has been offered multiple alternatives that he turned down, he came in today for labs & echo, I spoke to him and let him know that he would only receive the one bottle of sample today and that I had supplied PA paperwork, he did not want to take it & stated "I will just stop taking the Xarelto unless you supply me with it" in return I told him that we would not nor could we continue to supply him with the Xarelto, we needed him to be compliant in his OV and medication regiment, also that he had to help himself in applying for the PA that has been offered or consider alternative medication as that has been offered as well. He stated "OK I will do my part and fill out the papers and ask for his next appt date. We will monitor him and continue to work with him. I made him aware that unless he was compliant with this plan then we would not give him any more help/samples and he would have to go on a different medicine that had been offered. Pt  in agreement with plan.

## 2015-12-18 NOTE — Progress Notes (Signed)
Patient presents for echo today with heart rate of 140-160 bpm.  Dr. Elease HashimotoNahser went to room and advised patient that he will need to increase his dose of metoprolol to 50 mg twice daily and reschedule the echo.

## 2015-12-24 ENCOUNTER — Ambulatory Visit (INDEPENDENT_AMBULATORY_CARE_PROVIDER_SITE_OTHER): Payer: Self-pay | Admitting: Cardiovascular Disease

## 2015-12-24 ENCOUNTER — Encounter: Payer: Self-pay | Admitting: Cardiovascular Disease

## 2015-12-24 ENCOUNTER — Ambulatory Visit: Payer: Medicare Other | Admitting: Cardiovascular Disease

## 2015-12-24 VITALS — BP 120/90 | HR 106 | Ht 69.0 in | Wt 233.4 lb

## 2015-12-24 DIAGNOSIS — I350 Nonrheumatic aortic (valve) stenosis: Secondary | ICD-10-CM

## 2015-12-24 DIAGNOSIS — Z9114 Patient's other noncompliance with medication regimen: Secondary | ICD-10-CM

## 2015-12-24 DIAGNOSIS — I482 Chronic atrial fibrillation, unspecified: Secondary | ICD-10-CM

## 2015-12-24 DIAGNOSIS — I5022 Chronic systolic (congestive) heart failure: Secondary | ICD-10-CM

## 2015-12-24 MED ORDER — METOPROLOL TARTRATE 50 MG PO TABS: 75.0000 mg | ORAL_TABLET | Freq: Two times a day (BID) | ORAL | 3 refills | Status: DC

## 2015-12-24 NOTE — Patient Instructions (Signed)
Medication Instructions:  INCREASE Metoprolol to 75 mg twice daily (1 1/2 tabs twice daily)   Labwork: None Ordered   Testing/Procedures: None Ordered   Follow-Up: Your physician recommends that you schedule a follow-up appointment in: 3 months with Dr. Elease HashimotoNahser or APP    If you need a refill on your cardiac medications before your next appointment, please call your pharmacy.   Thank you for choosing [facility]! Eligha BridegroomMichelle Tequila Rottmann, RN [phone]

## 2015-12-24 NOTE — Progress Notes (Signed)
Cardiology Office Note   Date:  12/24/2015   ID:  Samuel [name], DOB Jun 26, 1945, MRN [id number]  PCP:  No PCP Per Patient  Cardiologist: Dr. Elease [name]   Notes from Dec. 2015:  Samuel PlanRoger T [name] is a 70 y.o. male with a history of HTN, chronic systolic CHF, atrial fibrillation and medical non compliance who [facility] with afib with RVR and massive volume overload. He was placed on heparin gtt, IV Lasix and rate control but left AMA before his work up and treatment were complete.  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 He saw [facility] back in March with complaints of dyspnea. He was noted to be in AF. He was advised to go to the hospital for admission but he declined (no one to care for his dog). He started medical therapy and was improved on follow up a couple of weeks later. He declined further evaluation from cardiology. He was seen again in May, still a little SOB. Again cardiology evaluation was recommended. He then presented to the ER on 12/28/13 with complaints of increasing dyspnea as well as intermittent chest pain and LE edema. He had been out of Lopressor for about a month.  He was admitted last week due to gross volume overload and afib with RVR. He was placed on IV Lasix, heparin gtt and metoprolol for rate control. He had an ECHO that revealed a newly reduced EF of 30-35% and akinesis of the basal-midinferior myocardium. Cath was recommended to rule out CAD. However, the patient refused and left AMA. He was discharged on Lasix 40mg  qd, metoprolol 50mg  BID and Xarelto.   Studies:  - Echo (12/29/13): EF 30-35%. Akinesis of the basal-midinferior myocardium. Mod AS. Mean gradient 21mmg HG. Mild- mod MR, mild LA dilation. Mild RA dilation. PA pressure 44mm HG. Mod sized L pleural effusion.   Oct. 18, 2017:  Samuel [name] is seen back today after a 2 year absence Seen with wife , Samuel [name]. Here because he ran out of meds -  He is taking  lasix 20 mg a day  Takes ASA when he remembers  No longer Taking metoprolol, potassium, Xarelto Is not on any other meds.   Has some shortness of breath,  Has DOE -   Especially for the past month  - since his lasix was cut back .   Nov. 27, 2017:  Samuel [name] is seen today for follow up  We started him in Metoprolol at his last visit He has had nausea and vomitting for the past day  He had an echo card gram last week which showed severe left ventricular dysfunction. There was aortic stenosis. The aortic valve was not well-seen but it appears that there could be severe aortic stenosis.  Breathing is ok.  Thinks he is feeling better since we doubled the metoprolol  He has run out of his Xarelto. He agrees to try to pick some up at the store. We had long discussion about Coumadin versus Xarelto and he refuses to take Coumadin.   I told him    that makes a lot more sense for him.  Recent Labs/Images:   Recent Labs  11/19/15 1525 12/18/15 1504  NA 137 136  K 3.7 4.6  BUN 13 10  CREATININE 1.13 0.89  ALT 9  --   LDLCALC 107  --   HDL 27*  --   BNP 334.8*  --  Dg Chest Port 1 View  12/28/2013   CLINICAL DATA:  Shortness of breath, chest pain.  EXAM: PORTABLE CHEST - 1 VIEW  COMPARISON:  None.  FINDINGS: The heart size and mediastinal contours are within normal limits. No pneumothorax or pleural effusion is noted. Small calcified granuloma is noted in left upper lobe. Mild right basilar opacity is noted concerning for pneumonia or subsegmental atelectasis. The visualized skeletal structures are unremarkable.  IMPRESSION: Mild right basilar pneumonia or subsegmental atelectasis. Followup radiographs are recommended until resolution.   Electronically Signed   By: Roque Lias M.D.   On: 12/28/2013 13:21     Wt Readings from Last 3 Encounters:  12/24/15 233 lb 6.4 oz (105.9 kg)  11/14/15 244 lb 12.8 oz (111 kg)  01/03/14 230 lb (104.3 kg)     Past Medical History:  Diagnosis Date   . Atrial fibrillation (HCC)    hx of atrial fibrilation  . CHF (congestive heart failure) (HCC)     Current Outpatient Prescriptions  Medication Sig Dispense Refill  . aspirin EC 81 MG EC tablet Take 1 tablet (81 mg total) by mouth daily.    . furosemide (LASIX) 40 MG tablet Take 1 tablet (40 mg total) by mouth daily. 90 tablet 3  . metoprolol tartrate (LOPRESSOR) 50 MG tablet Take 1 tablet (50 mg total) by mouth 2 (two) times daily. 180 tablet 3  . potassium chloride SA (K-DUR,KLOR-CON) 20 MEQ tablet Take 1 tablet (20 mEq total) by mouth daily. 90 tablet 3  . rivaroxaban (XARELTO) 20 MG TABS tablet Take 1 tablet (20 mg total) by mouth daily. 30 tablet 11   No current facility-administered medications for this visit.      Allergies:   Patient has no known allergies.   Social History:  The patient  reports that he has been smoking Pipe.  He has never used smokeless tobacco. He reports that he does not drink alcohol or use drugs.  No ETOH   Family History:  The patient's family history includes Alcohol abuse in his father; Arrhythmia in his father; Heart disease in his father.   ROS:  Please see the history of present illness.  All other systems reviewed and negative.    PHYSICAL EXAM: VS:  BP 120/90 (BP Location: Left Arm, Patient Position: Sitting, Cuff Size: Large)   Pulse (!) 106   Ht 5\' 9"  (1.753 m)   Wt 233 lb 6.4 oz (105.9 kg)   BMI 34.47 kg/m  Obese,  Disheveled, obese male , in NAD  HEENT: normal  Neck:  No JVD today  Cardiac:   irreg irreg, tachy, +SEM  Lungs:  clear to auscultation  Today .    Abd: soft, nontender, no hepatomegaly  Ext: no edema today  Skin: warm and dry  Neuro:  CNs 2-12 intact, no focal abnormalities noted  EKG:     ASSESSMENT AND PLAN:  Samuel [name] is a 70 y.o. male with a history of HTN, chronic systolic CHF, atrial fibrillation and medical non compliance who presented to [facility] on 12/28/13 with afib with RVR and massive volume overload.  He was placed on heparin gtt, IV Lasix and rate control but left AMA before his work up and treatment were complete.  Acute on chronic systolic CHF- 2D ECHO with   reduced EF (30-35%) and akinesis of basal-midinferior myocardium.  Some of this chronic systolic congestive heart failure is likely due to his rapid heart rate. We will continue to gradually up  titrate his metoprolol to achieve better rate control. Will repeat the echo when I see him again in 3 months.   Atrial fibrillation- He is still in afib.   CHADS2 VASC = 3 ( at least)  ( age, CHF HTN  )  His heart rate is better but still elevated. . We will increase the metoprolol to 75 mg twice a day.  We had a long discussion about anticoagulation. He refuses to take Coumadin.  I encouraged him that this would be the best option for him.  He said that he might take Xarelto. We will make sure that he has a prescription for Xarelto at his pharmacy.  Aortic stenosis- 2D ECHO with moderate stenosis. Peak velocity (S): 331cm/s. Mean gradient (S): 21 mm Hg. -- Continue to monitor. I did not hear any significant aortic stenosis on exam today.  HTN- BP well controlled today.       Kristeen MissPhilip Nahser, MD  12/24/2015 10:45 AM    [facility] [address],  [address] [place], [place]  [address] Pager [phone] Phone: [phone]; [phone]: [phone]

## 2016-01-10 ENCOUNTER — Ambulatory Visit: Payer: Medicare Other | Admitting: Cardiovascular Disease

## 2016-01-12 IMAGING — CR DG CHEST 1V PORT
1 series · 1 of 1 positions shown · non-contrast
Comparison: None.

CLINICAL DATA: Shortness of breath, chest pain.

EXAM:
PORTABLE CHEST - 1 VIEW

[AP]
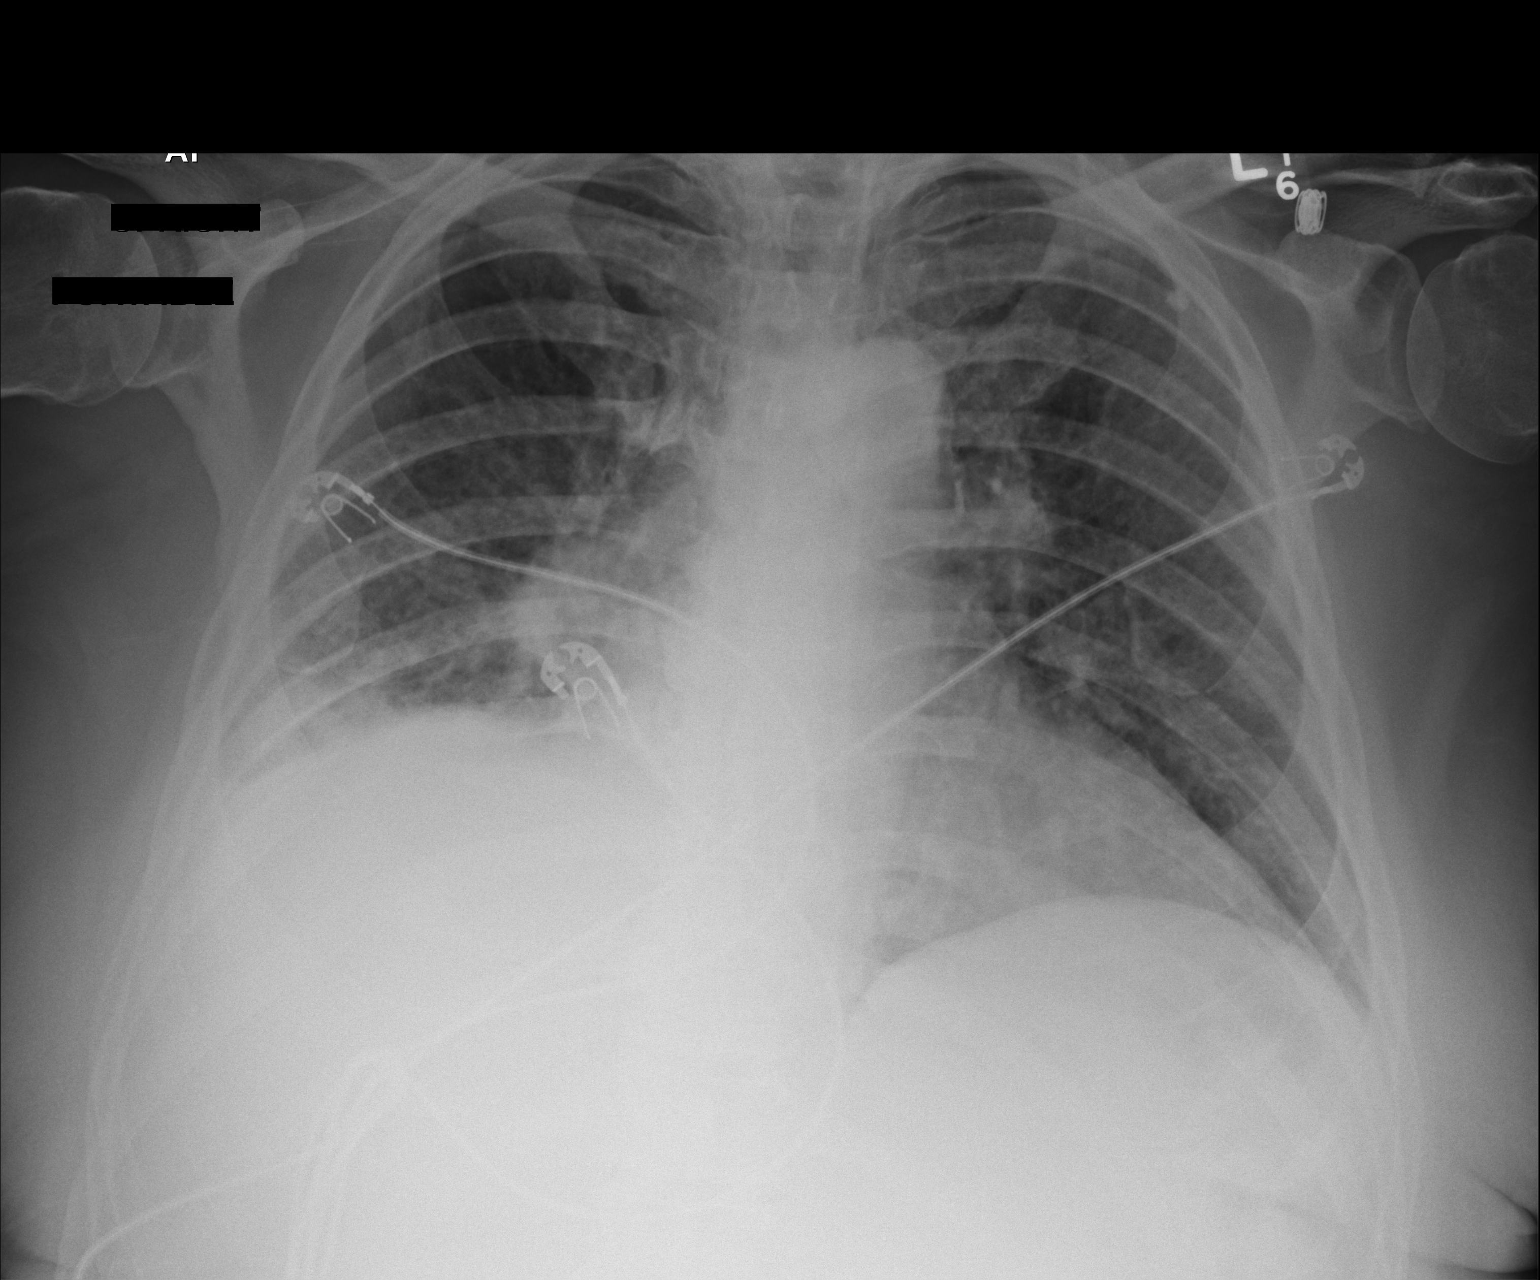

[1 of 1 positions shown; findings below may reference images not displayed]

FINDINGS: The heart size and mediastinal contours are within normal limits. No
pneumothorax or pleural effusion is noted. Small calcified granuloma
is noted in left upper lobe. Mild right basilar opacity is noted
concerning for pneumonia or subsegmental atelectasis. The visualized
skeletal structures are unremarkable.
IMPRESSION: Mild right basilar pneumonia or subsegmental atelectasis. Followup
radiographs are recommended until resolution.

## 2016-03-24 ENCOUNTER — Encounter: Payer: Self-pay | Admitting: Cardiovascular Disease

## 2016-03-24 ENCOUNTER — Encounter (INDEPENDENT_AMBULATORY_CARE_PROVIDER_SITE_OTHER): Payer: Self-pay

## 2016-03-24 ENCOUNTER — Ambulatory Visit (INDEPENDENT_AMBULATORY_CARE_PROVIDER_SITE_OTHER): Payer: Self-pay | Admitting: Cardiovascular Disease

## 2016-03-24 VITALS — BP 102/70 | HR 58 | Ht 69.0 in | Wt 231.6 lb

## 2016-03-24 DIAGNOSIS — I5022 Chronic systolic (congestive) heart failure: Secondary | ICD-10-CM

## 2016-03-24 DIAGNOSIS — I350 Nonrheumatic aortic (valve) stenosis: Secondary | ICD-10-CM

## 2016-03-24 NOTE — Patient Instructions (Signed)

## 2016-03-24 NOTE — Progress Notes (Signed)
Cardiology Office Note    Date:  03/24/2016   ID:  Samuel [name], DOB [birth year], MRN [id number]  PCP:  No PCP Per Patient  Cardiologist: Dr. Elease Hashimoto   Notes from Dec. 2015:  Samuel [name] is a 71 y.o. male with a history of HTN, chronic systolic CHF, atrial fibrillation and medical non compliance who presented to [facility] on 12/28/13 with afib with RVR and massive volume overload. He was placed on heparin gtt, IV Lasix and rate control but left AMA before his work up and treatment were complete.  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 He saw [facility] primary care back in March with complaints of dyspnea. He was noted to be in AF. He was advised to go to the hospital for admission but he declined (no one to care for his dog). He started medical therapy and was improved on follow up a couple of weeks later. He declined further evaluation from cardiology. He was seen again in May, still a little SOB. Again cardiology evaluation was recommended. He then presented to the ER on 12/28/13 with complaints of increasing dyspnea as well as intermittent chest pain and LE edema. He had been out of Lopressor for about a month.  He was admitted last week due to gross volume overload and afib with RVR. He was placed on IV Lasix, heparin gtt and metoprolol for rate control. He had an ECHO that revealed a newly reduced EF of 30-35% and akinesis of the basal-midinferior myocardium. Cath was recommended to rule out CAD. However, the patient refused and left AMA. He was discharged on Lasix 40mg  qd, metoprolol 50mg  BID and Xarelto.   Studies:  - Echo (12/29/13): EF 30-35%. Akinesis of the basal-midinferior myocardium. Mod AS. Mean gradient HG. Mild- mod MR, mild LA dilation. Mild RA dilation. PA pressure 44mm HG. Mod sized L pleural effusion.   Oct. 18, 2017:  Samuel [name] is seen back today after a 2 year absence Seen with wife , [name]. Here because he ran out of meds -  He is taking  lasix 20 mg a day  Takes ASA when he remembers  No longer Taking metoprolol, potassium, Xarelto Is not on any other meds.   Has some shortness of breath,  Has DOE -   Especially for the past month  - since his lasix was cut back .    Feb. 26, 2018:   Doing well from a cardiac standpoint.  Complains of back pain   Recent Labs/Images:   Recent Labs  11/19/15 1525 12/18/15 1504  NA 137 136  K 3.7 4.6  BUN 13 10  CREATININE 1.13 0.89  ALT 9  --   LDLCALC 107  --   HDL 27*  --   BNP 334.8*  --      Dg Chest Port 1 View  12/28/2013   CLINICAL DATA:  Shortness of breath, chest pain.  EXAM: PORTABLE CHEST - 1 VIEW  COMPARISON:  None.  FINDINGS: The heart size and mediastinal contours are within normal limits. No pneumothorax or pleural effusion is noted. Small calcified granuloma is noted in left upper lobe. Mild right basilar opacity is noted concerning for pneumonia or subsegmental atelectasis. The visualized skeletal structures are unremarkable.  IMPRESSION: Mild right basilar pneumonia or subsegmental atelectasis. Followup radiographs are recommended until resolution.   Electronically Signed   By: Roque Lias M.D.   On: 12/28/2013 13:21     Wt  Readings from Last 3 Encounters:  03/24/16 231 lb 9.6 oz (105.1 kg)  12/24/15 233 lb 6.4 oz (105.9 kg)  11/14/15 244 lb 12.8 oz (111 kg)     Past Medical History:  Diagnosis Date  . Atrial fibrillation (HCC)    hx of atrial fibrilation  . CHF (congestive heart failure) (HCC)     Current Outpatient Prescriptions  Medication Sig Dispense Refill  . aspirin EC 81 MG EC tablet Take 1 tablet (81 mg total) by mouth daily.    . furosemide (LASIX) 40 MG tablet Take 1 tablet (40 mg total) by mouth daily. 90 tablet 3  . metoprolol (LOPRESSOR) 50 MG tablet Take 1.5 tablets (75 mg total) by mouth 2 (two) times daily. 270 tablet 3  . potassium chloride SA (K-DUR,KLOR-CON) 20 MEQ tablet Take 1 tablet (20 mEq total) by mouth daily. 90 tablet 3    . rivaroxaban (XARELTO) 20 MG TABS tablet Take 1 tablet (20 mg total) by mouth daily. 30 tablet 11   No current facility-administered medications for this visit.      Allergies:   Patient has no known allergies.   Social History:  The patient  reports that he has been smoking Pipe.  He has never used smokeless tobacco. He reports that he does not drink alcohol or use drugs.  No ETOH   Family History:  The patient's family history includes Alcohol abuse in his father; Arrhythmia in his father; Heart disease in his father.   ROS:  Please see the history of present illness.  All other systems reviewed and negative.    PHYSICAL EXAM: VS:  BP 102/70 (BP Location: Right Arm, Patient Position: Sitting, Cuff Size: Large)   Pulse (!) 58   Ht 5\' 9"  (1.753 m)   Wt 231 lb 9.6 oz (105.1 kg)   SpO2 97%   BMI 34.20 kg/m  Well nourished, well developed, in no acute distress  HEENT: normal  Neck: +JVD  Cardiac:   irreg irreg,  +SEM  Lungs:  clear to auscultation bilaterally,   Abd: soft, nontender, no hepatomegaly  Ext: edema has largely resolved  Skin: warm and dry  Neuro:  CNs 2-12 intact, no focal abnormalities noted  EKG:      ASSESSMENT AND PLAN:  Samuel [name] is a 71 y.o. male with a history of HTN, chronic systolic CHF, atrial fibrillation and medical non compliance who presented [facility] with afib with RVR and massive volume overload. He was placed on heparin gtt, IV Lasix and rate control but left AMA before his work up and treatment were complete.  Acute on chronic systolic CHF- 2D ECHO with   reduced EF (30-35%) and akinesis of basal-midinferior myocardium.      Atrial fibrillation- HR is better on the Metoprolol . On Xarelto 20 mg a day.  Aortic stenosis- 2D ECHO with moderate stenosis. Peak velocity (S): 331cm/s. Mean gradient (S): 21 mm Hg. -- Continue to monitor    Kristeen MissPhilip Jaylun Fleener, MD  03/24/2016 3:50 PM    [facility] [address],  [address] [place], [place]  [address] Pager [phone] Phone: [phone]; Fax: [phone]

## 2016-11-24 ENCOUNTER — Other Ambulatory Visit: Payer: Self-pay

## 2016-11-24 MED ORDER — FUROSEMIDE 40 MG PO TABS: 40.0000 mg | ORAL_TABLET | Freq: Every day | ORAL | 1 refills | Status: DC

## 2017-04-21 ENCOUNTER — Other Ambulatory Visit: Payer: Self-pay | Admitting: Cardiovascular Disease

## 2017-07-15 ENCOUNTER — Other Ambulatory Visit: Payer: Self-pay | Admitting: Cardiovascular Disease

## 2017-09-14 ENCOUNTER — Other Ambulatory Visit: Payer: Self-pay | Admitting: Cardiovascular Disease

## 2017-11-07 ENCOUNTER — Other Ambulatory Visit: Payer: Self-pay | Admitting: Cardiovascular Disease

## 2017-11-16 ENCOUNTER — Encounter: Payer: Self-pay | Admitting: Cardiovascular Disease

## 2017-11-16 ENCOUNTER — Ambulatory Visit (INDEPENDENT_AMBULATORY_CARE_PROVIDER_SITE_OTHER): Payer: Self-pay | Admitting: Cardiovascular Disease

## 2017-11-16 VITALS — BP 100/68 | HR 98 | Ht 69.0 in | Wt 229.0 lb

## 2017-11-16 DIAGNOSIS — I5022 Chronic systolic (congestive) heart failure: Secondary | ICD-10-CM

## 2017-11-16 MED ORDER — POTASSIUM CHLORIDE CRYS ER 20 MEQ PO TBCR: 20.0000 meq | EXTENDED_RELEASE_TABLET | Freq: Two times a day (BID) | ORAL | 3 refills | Status: DC

## 2017-11-16 MED ORDER — METOPROLOL TARTRATE 50 MG PO TABS: 75.0000 mg | ORAL_TABLET | Freq: Two times a day (BID) | ORAL | 3 refills | Status: DC

## 2017-11-16 MED ORDER — FUROSEMIDE 40 MG PO TABS: 40.0000 mg | ORAL_TABLET | Freq: Every day | ORAL | 3 refills | Status: DC

## 2017-11-16 NOTE — Progress Notes (Signed)
Cardiology Office Note    Date:  11/16/2017   ID:  Samuel [name], DOB [birth year], MRN [id number]  PCP:  Patient, No Pcp Per  Cardiologist: Dr. Elease Hashimoto   Notes from Dec. 2015:  Samuel [name] is a 72 y.o. male with a history of HTN, chronic systolic CHF, atrial fibrillation and medical non compliance who presented to [facility] on 12/28/13 with afib with RVR and massive volume overload. He was placed on heparin gtt, IV Lasix and rate control but left AMA before his work up and treatment were complete.  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 He saw [place] primary care back in March with complaints of dyspnea. He was noted to be in AF. He was advised to go to the hospital for admission but he declined (no one to care for his dog). He started medical therapy and was improved on follow up a couple of weeks later. He declined further evaluation from cardiology. He was seen again in May, still a little SOB. Again cardiology evaluation was recommended. He then presented to the ER on 12/28/13 with complaints of increasing dyspnea as well as intermittent chest pain and LE edema. He had been out of Lopressor for about a month.  He was admitted last week due to gross volume overload and afib with RVR. He was placed on IV Lasix, heparin gtt and metoprolol for rate control. He had an ECHO that revealed a newly reduced EF of 30-35% and akinesis of the basal-midinferior myocardium. Cath was recommended to rule out CAD. However, the patient refused and left AMA. He was discharged on Lasix 40mg  qd, metoprolol 50mg  BID and Xarelto.   Studies:  - Echo (12/29/13): EF 30-35%. Akinesis of the basal-midinferior myocardium. Mod AS. Mean gradient HG. Mild- mod MR, mild LA dilation. Mild RA dilation. PA pressure 44mm HG. Mod sized L pleural effusion.   Oct. 18, 2017:  Samuel [name] is seen back today after a 2 year absence Seen with wife , Samuel [name]. Here because he ran out of meds -  He is  taking lasix 20 mg a day  Takes ASA when he remembers  No longer Taking metoprolol, potassium, Xarelto Is not on any other meds.   Has some shortness of breath,  Has DOE -   Especially for the past month  - since his lasix was cut back .    Feb. 26, 2018:   Doing well from a cardiac standpoint.  Complains of back pain   Oct. 21. 2019  Tee is seen today for follow up for his atrial fib , CHF  Lives by himself. Does not really cook.   Eats lots of sandwiches .  Has DOE walking to the mailbox  Has leg cramps.   Runs out of his potassium frequently    Recent Labs/Images:  No results for input(s): NA, K, BUN, CREATININE, ALT, HGB, TSH, LDLCALC, LDLDIRECT, HDL, BNP, PROBNP in the last 8760 hours.  Invalid input(s): LDL   Dg Chest Port 1 View  12/28/2013   CLINICAL DATA:  Shortness of breath, chest pain.  EXAM: PORTABLE CHEST - 1 VIEW  COMPARISON:  None.  FINDINGS: The heart size and mediastinal contours are within normal limits. No pneumothorax or pleural effusion is noted. Small calcified granuloma is noted in left upper lobe. Mild right basilar opacity is noted concerning for pneumonia or subsegmental atelectasis. The visualized skeletal structures are unremarkable.  IMPRESSION: Mild right basilar pneumonia or  subsegmental atelectasis. Followup radiographs are recommended until resolution.   Electronically Signed   By: Roque Lias M.D.   On: 12/28/2013 13:21     Wt Readings from Last 3 Encounters:  03/24/16 231 lb 9.6 oz (105.1 kg)  12/24/15 233 lb 6.4 oz (105.9 kg)  11/14/15 244 lb 12.8 oz (111 kg)     Past Medical History:  Diagnosis Date  . Atrial fibrillation (HCC)    hx of atrial fibrilation  . CHF (congestive heart failure) (HCC)     Current Outpatient Medications  Medication Sig Dispense Refill  . aspirin EC 81 MG EC tablet Take 1 tablet (81 mg total) by mouth daily.    . furosemide (LASIX) 40 MG tablet Take 40 mg by mouth daily.    . metoprolol tartrate  (LOPRESSOR) 50 MG tablet Take 75 mg by mouth 2 (two) times daily.    . potassium chloride SA (K-DUR,KLOR-CON) 20 MEQ tablet Take 20 mEq by mouth 2 (two) times daily.    . rivaroxaban (XARELTO) 20 MG TABS tablet Take 1 tablet (20 mg total) by mouth daily. 30 tablet 11   No current facility-administered medications for this visit.      Allergies:   Patient has no known allergies.   Social History:  The patient  reports that he has been smoking pipe. He has never used smokeless tobacco. He reports that he does not drink alcohol or use drugs.  No ETOH   Family History:  The patient's family history includes Alcohol abuse in his father; Arrhythmia in his father; Heart disease in his father.   ROS:  Please see the history of present illness.  All other systems reviewed and negative.    Physical Exam: There were no vitals taken for this visit.  GEN:   Elderly , chronically ill appearing man  HEENT: Normal NECK: No JVD; No carotid bruits LYMPHATICS: No lymphadenopathy CARDIAC: irreg. Irreg.  RESPIRATORY:  Clear to auscultation without rales, wheezing or rhonchi  ABDOMEN: Soft, non-tender, non-distended MUSCULOSKELETAL:  No edema; No deformity  SKIN: Warm and dry NEUROLOGIC:  Alert and oriented x 3   EKG:    November 16, 2017: Atrial fibrillation with a ventricular rate of 106.  Poor R wave progression-possible septal myocardial infarction.  ASSESSMENT AND PLAN:  Samuel [name] is a 72 y.o. male with a history of HTN, chronic systolic CHF, atrial fibrillation and medical non compliance who presented to [facility] on 12/28/13 with afib with RVR and massive volume overload. He was placed on heparin gtt, IV Lasix and rate control but left AMA before his work up and treatment were complete.  Acute on chronic systolic CHF- 2D ECHO with   reduced EF (30-35%) and akinesis of basal-midinferior myocardium.   continue lasix,  Refill Kdur   Atrial fibrillation-  Stable,   Ventricular rate is better .     Aortic stenosis-  Has moderate AS    Kristeen Miss, MD  11/16/2017 5:08 PM    [facility] [address] [place],  [address] [place], [place]  [address] Pager [phone] Phone: [phone]; Fax: [phone]

## 2017-11-16 NOTE — Patient Instructions (Signed)
Medication Instructions:  Your physician recommends that you continue on your current medications as directed. Please refer to the Current Medication list given to you today.  If you need a refill on your cardiac medications before your next appointment, please call your pharmacy.   Lab work: Your physician recommends that you return for lab work in: CALL TO MAKE AN APPOINTMENT FOR BMET WITHIN NEXT 2 WEEKS  If you have labs (blood work) drawn today and your tests are completely normal, you will receive your results only by: Marland Kitchen MyChart Message (if you have MyChart) OR . A paper copy in the mail If you have any lab test that is abnormal or we need to change your treatment, we will call you to review the results.  Testing/Procedures: None Ordered   Follow-Up: At [facility], you and your health needs are our priority.  As part of our continuing mission to provide you with exceptional heart care, we have created designated Provider Care Teams.  These Care Teams include your primary Cardiologist (physician) and Advanced Practice Providers (APPs -  Physician Assistants and Nurse Practitioners) who all work together to provide you with the care you need, when you need it. You will need a follow up appointment in:  1 years.  Please call our office 2 months in advance to schedule this appointment.  You may see Kristeen Miss, MD or one of the following Advanced Practice Providers on your designated Care Team: Tereso Newcomer, PA-C Vin Maloy, [place] . Berton Bon, NP

## 2017-12-02 ENCOUNTER — Other Ambulatory Visit: Payer: Self-pay | Admitting: Nurse Practitioner

## 2017-12-02 ENCOUNTER — Other Ambulatory Visit: Payer: Self-pay | Admitting: *Deleted

## 2017-12-02 DIAGNOSIS — I5022 Chronic systolic (congestive) heart failure: Secondary | ICD-10-CM

## 2017-12-02 DIAGNOSIS — I482 Chronic atrial fibrillation, unspecified: Secondary | ICD-10-CM

## 2017-12-03 LAB — BASIC METABOLIC PANEL
BUN/Creatinine Ratio: 13 (ref 10–24)
BUN: 12 mg/dL (ref 8–27)
CO2: 22 mmol/L (ref 20–29)
Calcium: 9.4 mg/dL (ref 8.6–10.2)
Chloride: 97 mmol/L (ref 96–106)
Creatinine, Ser: 0.96 mg/dL (ref 0.76–1.27)
GFR, EST AFRICAN AMERICAN: 90 mL/min/{1.73_m2} (ref >59)
GFR, EST NON AFRICAN AMERICAN: 78 mL/min/{1.73_m2} (ref >59)
Glucose: 176 mg/dL — ABNORMAL HIGH (ref 65–99)
Potassium: 4.7 mmol/L (ref 3.5–5.2)
SODIUM: 137 mmol/L (ref 134–144)

## 2017-12-03 LAB — CBC
Hematocrit: 48.7 % (ref 37.5–51.0)
Hemoglobin: 16.4 g/dL (ref 13.0–17.7)
MCH: 32.7 pg (ref 26.6–33.0)
MCHC: 33.7 g/dL (ref 31.5–35.7)
MCV: 97 fL (ref 79–97)
Platelets: 199 10*3/uL (ref 150–450)
RBC: 5.01 x10E6/uL (ref 4.14–5.80)
RDW: 13.3 % (ref 12.3–15.4)
WBC: 8.9 10*3/uL (ref 3.4–10.8)

## 2018-07-21 ENCOUNTER — Telehealth: Payer: Self-pay | Admitting: Cardiovascular Disease

## 2018-07-21 NOTE — Telephone Encounter (Signed)
I spoke to the patient who is SOB with exertion and has noticed swelling in both feet.  I have scheduled an appointment with Vin 6/25.      COVID-19 Pre-Screening Questions:  . In the past 7 to 10 days have you had a cough,  shortness of breath, headache, congestion, fever (100 or greater) body aches, chills, sore throat, or sudden loss of taste or sense of smell?  no . Have you been around anyone with known Covid 19 no . Have you been around anyone who is awaiting Covid 19 test results in the past 7 to 10 days?  no . Have you been around anyone who has been exposed to Covid 19, or has mentioned symptoms of Covid 19 within the past 7 to 10 days?  no  If you have any concerns/questions about symptoms patients report during screening (either on the phone or at threshold). Contact the provider seeing the patient or DOD for further guidance.  If neither are available contact a member of the leadership team.

## 2018-07-21 NOTE — Telephone Encounter (Signed)
New Message   Pt c/o Shortness Of Breath: STAT if SOB developed within the last 24 hours or pt is noticeably SOB on the phone  1. Are you currently SOB (can you hear that pt is SOB on the phone)?Slightly, he says he has been this way since the 1st of year just holding off on calling the provider.   2. How long have you been experiencing SOB? All year per patient  3. Are you SOB when sitting or when up moving around? Moving around   4. Are you currently experiencing any other symptoms? No

## 2018-07-22 ENCOUNTER — Ambulatory Visit (INDEPENDENT_AMBULATORY_CARE_PROVIDER_SITE_OTHER): Payer: Self-pay | Admitting: Family

## 2018-07-22 ENCOUNTER — Telehealth: Payer: Self-pay | Admitting: Family

## 2018-07-22 ENCOUNTER — Other Ambulatory Visit: Payer: Self-pay

## 2018-07-22 ENCOUNTER — Encounter: Payer: Self-pay | Admitting: Physician Assistant

## 2018-07-22 VITALS — BP 110/82 | HR 107 | Ht 69.0 in | Wt 220.4 lb

## 2018-07-22 DIAGNOSIS — Z9114 Patient's other noncompliance with medication regimen: Secondary | ICD-10-CM

## 2018-07-22 DIAGNOSIS — I5022 Chronic systolic (congestive) heart failure: Secondary | ICD-10-CM

## 2018-07-22 DIAGNOSIS — I350 Nonrheumatic aortic (valve) stenosis: Secondary | ICD-10-CM

## 2018-07-22 DIAGNOSIS — R0602 Shortness of breath: Secondary | ICD-10-CM

## 2018-07-22 DIAGNOSIS — I5023 Acute on chronic systolic (congestive) heart failure: Secondary | ICD-10-CM

## 2018-07-22 DIAGNOSIS — Z91148 Patient's other noncompliance with medication regimen for other reason: Secondary | ICD-10-CM

## 2018-07-22 DIAGNOSIS — I482 Chronic atrial fibrillation, unspecified: Secondary | ICD-10-CM

## 2018-07-22 MED ORDER — POTASSIUM CHLORIDE CRYS ER 20 MEQ PO TBCR: 20.0000 meq | EXTENDED_RELEASE_TABLET | Freq: Two times a day (BID) | ORAL | 3 refills | Status: AC

## 2018-07-22 MED ORDER — FUROSEMIDE 40 MG PO TABS: 40.0000 mg | ORAL_TABLET | Freq: Every day | ORAL | 3 refills | Status: DC

## 2018-07-22 MED ORDER — METOPROLOL TARTRATE 50 MG PO TABS: 75.0000 mg | ORAL_TABLET | Freq: Two times a day (BID) | ORAL | 3 refills | Status: DC

## 2018-07-22 NOTE — Telephone Encounter (Signed)
[facility] Visit Initial Request  Date of Request ([place]):  July 22, 2018  Requesting Provider:  Laurann Montana NP-C    Agency Requested:    [facility] Contact:  Glory Buff, NP [address] [place], [place] [address] Phone #:  [phone] Fax #:  [phone]  Patient Demographic Information: Name:  Samuel [name] Age:  73 y.o.   DOB:  May 07, 1945  MRN:  [id number]   Address:   [address] [place] [address]   Phone Numbers:   Home Phone [phone]     Emergency Contact Information on File:   Contact Information    Name Relation Home Work Mobile   Cates,Wl Brother [phone]        The above family members may be contacted for information on this patient (review DPR on file):  Yes    Patient Clinical Information:  Primary Care Provider:  Patient, No Pcp Per  Primary Cardiologist:  Mertie Moores, MD  Primary Electrophysiologist:  None   Past Medical Hx: Mr. Begley  has a past medical history of Atrial fibrillation ([place]) and CHF (congestive heart failure) ([place]).   Allergies: He has No Known Allergies.   Medications: Current Outpatient Medications on File Prior to Visit  Medication Sig  . furosemide (LASIX) 40 MG tablet Take 1 tablet (40 mg total) by mouth daily.  . metoprolol tartrate (LOPRESSOR) 50 MG tablet Take 1.5 tablets (75 mg total) by mouth 2 (two) times daily.  . potassium chloride SA (K-DUR) 20 MEQ tablet Take 1 tablet (20 mEq total) by mouth 2 (two) times daily.   No current facility-administered medications on file prior to visit.      Social Hx: He  reports that he has been smoking pipe. He has never used smokeless tobacco. He reports that he does not drink alcohol or use drugs.    Diagnosis/Reason for Visit:   Decompensated heart failure on office visit 07/22/2018. Restarted on daily diuretic.   Services Requested:  Vital Signs (BP, Pulse, O2, Weight)  Physical Exam  Labs:  BMET,  ProBNP  # of Visits Needed/Frequency per Week: 3 visits every other week for total of 6 weeks of monitoring  YES - A copy of the office note will be faxed with this form. YES - All labs ordered for this home visit have been released and the request was sent to Chrissie Noa at [facility].

## 2018-07-22 NOTE — Progress Notes (Signed)
Cardiology Office Note:    Date:  07/22/2018   ID:  Samuel [name], DOB 11/18/1945, MRN [id number]  PCP:  Patient, No Pcp Per  Cardiologist:  Kristeen MissPhilip Nahser, MD  Electrophysiologist:  None   Referring MD: No ref. provider found   No chief complaint on file. 73 yo male presents for shortness of breath on exertion and swelling in bilateral feet.   History of Present Illness:    Samuel [name] is a 73 y.o. male with a hx of HTN, chronic systolic CHF, atrial fibrillation last seen by Dr. Elease HashimotoNahser 11/16/2017. At that office visit noted DOE walking to the mailbox, recommended annual follow up.   Notable history of admission 12/28/13 for afib RVR and volume overload - placed on heparin drip, IV lasix, rate control - left AMA prior to completion of treatment plan. Seen October 2017 after running out of medications.   Most recent echo 12/18/2015 with EF 30-35%, LV with moderate concentric hypertrophy, moderate diffuse hypokinesis, AV mild regurgitation, MV mild regurgitation, LA severely dilated, RV mildly reduced systolic function, RA mildly dilate. Moderate aortic stenosis per Dr. Elease HashimotoNahser.   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 Over the last 2 months reports shortness of breath with exertion and lower extremity edema. Reports he had lost weight and now is regaining weight. He tells me he gets a "cold feeling" across his chest from R to L intermittently, but no chest pain.   Reports as little activity as driving the riding lawn morning makes him short of breath. Resolves with 5-10 minutes of rest. Endorses PND - has been sleeping in the recliner every night.  Complains of not sleeping well, tells me he does not think he snores but he lives alone.  Reports daytime sleepiness.  No exercise regimen.  Current shortness of breath has limited his activity level.  Eats mostly at home but makes quick meals such as sandwiches.  Tells me he has been taking his Lasix and potassium "about every other  day" because he does not like having to go to the restroom frequently.  Long discussion regarding the use of diuretic therapy in the setting of heart failure.  Lengthy amount of time spent reviewing his echocardiogram from 2017.  Tells me he has not been taking his anticoagulation as it is too expensive.  Much of our discussion was spent discussing his concerns of cost of follow-up and medications.   Past Medical History:  Diagnosis Date  . Atrial fibrillation (HCC)    hx of atrial fibrilation  . CHF (congestive heart failure) (HCC)     Past Surgical History:  Procedure Laterality Date  . FINGER SURGERY    . VASECTOMY      Current Medications: Current Meds  Medication Sig  . furosemide (LASIX) 40 MG tablet Take 1 tablet (40 mg total) by mouth daily.  . metoprolol tartrate (LOPRESSOR) 50 MG tablet Take 1.5 tablets (75 mg total) by mouth 2 (two) times daily.  . potassium chloride SA (K-DUR) 20 MEQ tablet Take 1 tablet (20 mEq total) by mouth 2 (two) times daily.  . [DISCONTINUED] furosemide (LASIX) 40 MG tablet Take 1 tablet (40 mg total) by mouth daily.  . [DISCONTINUED] metoprolol tartrate (LOPRESSOR) 50 MG tablet Take 1.5 tablets (75 mg total) by mouth 2 (two) times daily.  . [DISCONTINUED] potassium chloride SA (K-DUR,KLOR-CON) 20 MEQ tablet Take 1 tablet (20 mEq total) by mouth 2 (two) times daily.     Allergies:   Patient  has no known allergies.   Social History   Socioeconomic History  . Marital status: Single    Spouse name: Not on file  . Number of children: 2  . Years of education: Not on file  . Highest education level: Not on file  Occupational History  . Occupation: retired  Engineer, productionocial Needs  . Financial resource strain: Not on file  . Food insecurity    Worry: Not on file    Inability: Not on file  . Transportation needs    Medical: Not on file    Non-medical: Not on file  Tobacco Use  . Smoking status: Current Every Day Smoker    Types: Pipe  . Smokeless  tobacco: Never Used  . Tobacco comment: pipe 3 times /day  Substance and Sexual Activity  . Alcohol use: No  . Drug use: No  . Sexual activity: Not on file  Lifestyle  . Physical activity    Days per week: Not on file    Minutes per session: Not on file  . Stress: Not on file  Relationships  . Social Musicianconnections    Talks on phone: Not on file    Gets together: Not on file    Attends religious service: Not on file    Active member of club or organization: Not on file    Attends meetings of clubs or organizations: Not on file    Relationship status: Not on file  Other Topics Concern  . 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    Lost a daughter to an accident   He lives alone with his 2 dogs. His Son lives in [place]. He has 3 grandchildren.     Family History: The patient's family history includes Alcohol abuse in his father; Arrhythmia in his father; Heart disease in his father.  ROS:   Please see the history of present illness.    Review of Systems  Constitution: Positive for malaise/fatigue and weight gain. Negative for chills.  Cardiovascular: Positive for dyspnea on exertion, leg swelling and paroxysmal nocturnal dyspnea. Negative for chest pain, irregular heartbeat, near-syncope, palpitations and syncope.  Respiratory: Positive for shortness of breath and sleep disturbances due to breathing. Negative for cough, snoring and wheezing.   Gastrointestinal: Negative for diarrhea, nausea and vomiting.  Neurological: Negative for dizziness, light-headedness and weakness.    All other systems reviewed and are negative.  EKGs/Labs/Other Studies Reviewed:    The following studies were reviewed today: Echo 12/18/2015 Study Conclusions - Left ventricle: The cavity size was normal. There was moderate concentric hypertrophy. Systolic function was moderately to severely reduced. The estimated ejection fraction was in the  range of 30% to  35%. Moderate diffuse hypokinesis with no identifiable regional variations. - Aortic valve: There was mild regurgitation. - Mitral valve: There was mild regurgitation. - Left atrium: The atrium was severely dilated. - Right ventricle: Systolic function was mildly reduced. - Right atrium: The atrium was mildly dilated.   Impressions: - Evaluation of the aortic valve is impaired by poor image quality and rapid irregular rhythm. Howver, there is suggestion of possible severe aortic stenosis with low gradient. After a longer diastole, the peak aortic gradient at times exceeds 60 mm Hg. Also consider a component of tachycardia related cardiomyopathy.    Recent Labs: 12/02/2017: BUN 12; Creatinine, Ser 0.96; Hemoglobin 16.4; Platelets 199; Potassium 4.7; Sodium 137  Recent Lipid Panel    Component Value Date/Time  CHOL 157 11/19/2015 1525   TRIG 115 11/19/2015 1525   HDL 27 (L) 11/19/2015 1525   CHOLHDL 5.8 (H) 11/19/2015 1525   VLDL 23 11/19/2015 1525   LDLCALC 107 11/19/2015 1525    Physical Exam:    VS:  BP 110/82   Pulse (!) 107   Ht 5\' 9"  (1.753 m)   Wt 220 lb 6.4 oz (100 kg)   SpO2 97%   BMI 32.55 kg/m     Wt Readings from Last 3 Encounters:  07/22/18 220 lb 6.4 oz (100 kg)  11/16/17 229 lb (103.9 kg)  03/24/16 231 lb 9.6 oz (105.1 kg)     GEN:  Well nourished, well developed in no acute distress HEENT: Normal NECK: No JVD; No carotid bruits LYMPHATICS: No lymphadenopathy CARDIAC: irregularly irregular, no murmurs, rubs, gallops tachycardic RESPIRATORY:  Clear to auscultation without rales, wheezing or rhonchi diminished bases bilaterally ABDOMEN: Soft, non-tender, non-distended MUSCULOSKELETAL:  2+ lower extremity edema bilaterally from foot to knee; No deformity  SKIN: Warm and dry Reddened plaque to L calf consistent with history of psoriasiss NEUROLOGIC:  Alert and oriented x 3 PSYCHIATRIC:  Normal affect   ASSESSMENT:    1. Chronic systolic CHF (congestive  heart failure) (HCC)   2. Nonrheumatic aortic valve stenosis   3. Chronic atrial fibrillation   4. Chronic anticoagulation    PLAN:    In order of problems listed above:  1. Chronic systolic heart failure - Decompensated on exam.  Reports 6963-month history of shortness of breath and lower extremity edema.  2+ pitting edema on bilateral lower extremity from foot to knee.  Has not been taking his Lasix nor potassium daily.  Noted history of noncompliance.  Long discussion regarding need for diuretic therapy in heart failure.  Restart daily Lasix and potassium at previous doses.  Refill sent for 90 days per his request.   ProBNP and BMP today.  Of note he left today without getting labs as asked  [facility] home health visit in 1 week with BMP, ProBNP.    Follow-up in 3 weeks with Dr. Elease HashimotoNahser.   Consider repeat echocardiogram for assessment of LV function and consideration of Entresto. 2. AV stenosis - Moderate by echo 2017.  He denies chest pain, syncope, presyncope.  Endorses shortness of breath in the setting of decompensated heart failure.  Consider repeat echocardiogram at next office visit for monitoring of aortic stenosis as well as ejection fraction. 3. Chronic atrial fibrillation - Mildly tachycardic today with rate of 100.  Likely in the setting of volume overload.  Continue metoprolol 50 mg twice daily.  Refill sent to patient's [facility] for 90 day supply. Currently not on anticoagulation secondary to patient was asked to get labs today and he left to concern of cost.  He declines to resume anticoagulation at this time.  Will continue to address at subsequent office visits.  Chads vas score of 3 (age, HF, HTN). 4. Noncompliance with medication regimen - Since last office visit has stopped his Xarelto due to cost.  Additionally has been taking Lasix and potassium every other day. Education provided. He is agreeable to restart Lasix and potassium.    Medication Adjustments/Labs and Tests  Ordered: Current medicines are reviewed at length with the patient today.  Concerns regarding medicines are outlined above.  Orders Placed This Encounter  Procedures  . Basic metabolic panel  . Pro b natriuretic peptide (BNP)   Meds ordered this encounter  Medications  . potassium chloride  SA (K-DUR) 20 MEQ tablet    Sig: Take 1 tablet (20 mEq total) by mouth 2 (two) times daily.    Dispense:  180 tablet    Refill:  3  . metoprolol tartrate (LOPRESSOR) 50 MG tablet    Sig: Take 1.5 tablets (75 mg total) by mouth 2 (two) times daily.    Dispense:  180 tablet    Refill:  3  . furosemide (LASIX) 40 MG tablet    Sig: Take 1 tablet (40 mg total) by mouth daily.    Dispense:  90 tablet    Refill:  3    Patient Instructions  Medication Instructions:  Please make sure that you are taking your daily medications everyday   If you need a refill on your cardiac medications before your next appointment, please call your pharmacy.   Lab work: TODAY: BMET & BNP  FUTURE: BMET (To be done by [facility])  If you have labs (blood work) drawn today and your tests are completely normal, you will receive your results only by: Marland Kitchen MyChart Message (if you have MyChart) OR . A paper copy in the mail If you have any lab test that is abnormal or we need to change your treatment, we will call you to review the results.  Testing/Procedures: None  Follow-Up: You are scheduled to see Dr. Acie Fredrickson on 08/13/2018 @ 3:20 PM  Any Other Special Instructions Will Be Listed Below (If Applicable).       Signed, Loel Dubonnet, NP  07/22/2018 3:49 PM    [facility]

## 2018-07-22 NOTE — Patient Instructions (Signed)
Medication Instructions:  Please make sure that you are taking your daily medications everyday   If you need a refill on your cardiac medications before your next appointment, please call your pharmacy.   Lab work: TODAY: BMET & BNP  FUTURE: BMET (To be done by [facility])  If you have labs (blood work) drawn today and your tests are completely normal, you will receive your results only by: Marland Kitchen MyChart Message (if you have MyChart) OR . A paper copy in the mail If you have any lab test that is abnormal or we need to change your treatment, we will call you to review the results.  Testing/Procedures: None  Follow-Up: You are scheduled to see Dr. Acie Fredrickson on 08/13/2018 @ 3:20 PM  Any Other Special Instructions Will Be Listed Below (If Applicable).

## 2018-07-23 ENCOUNTER — Telehealth: Payer: Self-pay

## 2018-07-23 NOTE — Telephone Encounter (Addendum)
Mr. Mauriello did not have labs drawn prior to leaving office as asked. Added d-dimer to home health labs as he notes SOB and has self-discontinued his anticoagulant in the setting of atrial fibrillation. If d-dimer positive will need CTA to rule out PE.   Will re-route note to [place] as well as Selinda Eon of [facility] via [place] fax function.   Loel Dubonnet, NP

## 2018-07-23 NOTE — Telephone Encounter (Signed)
On behalf of [facility], called pt x 2, several hours apart and left voice mails to try and set up appointment for a home visit to draw labs, complete exam/vitals as ordered.  Pt has not called back.  Will inform ordering provider.  [facility] NP and PT aware and will attempt to reach patient after the weekend on Monday.

## 2018-07-23 NOTE — Progress Notes (Signed)
Samuel [name] did not have labs drawn prior to leaving office as asked. Added d-dimer to home health labs as he notes SOB and has self-discontinued his anticoagulant in the setting of atrial fibrillation. If d-dimer positive will need CTA to rule out PE.   Will re-route note to Samuel [name] [facility] as well as Samuel [name] of [facility] via [facility] fax function.   Samuel Norgard S Rasheema [place], NP   

## 2018-07-23 NOTE — Addendum Note (Signed)
Addended by: Loel Dubonnet on: 07/23/2018 07:42 AM   Modules accepted: Orders

## 2018-07-28 ENCOUNTER — Encounter: Payer: Self-pay | Admitting: Family

## 2018-07-29 ENCOUNTER — Telehealth: Payer: Self-pay | Admitting: Family

## 2018-07-29 ENCOUNTER — Telehealth: Payer: Self-pay

## 2018-07-29 DIAGNOSIS — I5023 Acute on chronic systolic (congestive) heart failure: Secondary | ICD-10-CM

## 2018-07-29 DIAGNOSIS — I482 Chronic atrial fibrillation, unspecified: Secondary | ICD-10-CM

## 2018-07-29 LAB — BASIC METABOLIC PANEL
BUN/Creatinine Ratio: 14 (ref 10–24)
BUN: 13 mg/dL (ref 8–27)
CO2: 24 mmol/L (ref 20–29)
Calcium: 9.2 mg/dL (ref 8.6–10.2)
Chloride: 103 mmol/L (ref 96–106)
Creatinine, Ser: 0.96 mg/dL (ref 0.76–1.27)
GFR calc Af Amer: 90 mL/min/{1.73_m2} (ref >59)
GFR calc non Af Amer: 78 mL/min/{1.73_m2} (ref >59)
Glucose: 107 mg/dL — ABNORMAL HIGH (ref 65–99)
Potassium: 4.5 mmol/L (ref 3.5–5.2)
Sodium: 142 mmol/L (ref 134–144)

## 2018-07-29 LAB — PRO B NATRIURETIC PEPTIDE: NT-Pro BNP: 5086 pg/mL — ABNORMAL HIGH (ref 0–376)

## 2018-07-29 LAB — D-DIMER, QUANTITATIVE: D-DIMER: 0.81 mg/L FEU — ABNORMAL HIGH (ref 0.00–0.49)

## 2018-07-29 NOTE — Telephone Encounter (Signed)
Notes recorded by Frederik Schmidt, RN on 07/29/2018 at 1:51 PM EDT  Left patient message with recommendations and told him to call, if questions. 7/2  ------

## 2018-07-29 NOTE — Telephone Encounter (Signed)
Notes recorded by Frederik Schmidt, RN on 07/29/2018 at 8:18 AM EDT  [email or web address] 7/2  ------

## 2018-07-29 NOTE — Progress Notes (Signed)
Reviewed. Discussed with Dr. Acie Fredrickson. Glad he is feeling better.

## 2018-07-29 NOTE — Telephone Encounter (Signed)
-----   Message from Loel Dubonnet, NP sent at 07/29/2018  1:36 PM EDT ----- Reviewed with Dr. Acie Fredrickson. Mildly elevated when compared to normal for his age. At Dr. Elmarie Shiley recommendation, no repeat needed nor further testing required.   He was previously called for results of his BMP and ProBNP.   Reviewed note from home health. Tell him I am glad he is feeling better!

## 2018-07-29 NOTE — Telephone Encounter (Signed)
[facility] Visit Follow Up Request   Date of Request ([place]):  July 29, 2018  Requesting Provider:  Laurann Montana NP-C    Agency Requested:    [facility] Contact:  Glory Buff, NP [address] [place], [place] [address] Phone #:  [phone] Fax #:  [phone]  Patient Demographic Information: Name:  KATO WIECZOREK Age:  73 y.o.   DOB:  11-03-1945  MRN:  [id number]   Home visit progress note(s), lab results, telemetry strips, etc were reviewed.  Provider Recommendations:  Care of plan and labs reviewed with Dr. Acie Fredrickson. Glad he is feeling better! No plan for CTA or repeat d-dimer at this time per Dr. Acie Fredrickson.   Recommendation for continued following by [facility].   Please repeat ProBNP and BMP at next home health visit.   Follow up home services requested:  Vital Signs (BP, Pulse, O2, Weight)  Physical Exam  Medication Reconciliation  Labs:  ProBNP, BMP   Education on low sodium diet and fluid restriction.   YES - All labs ordered for this home visit have been released and the request was sent to Chrissie Noa at [facility].

## 2018-07-29 NOTE — Telephone Encounter (Signed)
Reviewed with Dr. Acie Fredrickson. Mildly elevated when compared to normal for his age. At Dr. Elmarie Shiley recommendation, no repeat needed nor further testing required.   He was previously called for results of his BMP and ProBNP.   Reviewed note from home health. Tell him I am glad he is feeling better!

## 2018-07-29 NOTE — Progress Notes (Signed)
07/29/18  D-dimer resulted 0.81 - cut off for his age 73.74. Reviewed with Dr. Acie Fredrickson, no CTA nor repeat level at this time. Symptoms remarkably improved when seen by [facility] yesterday, file available under 'media' tab.   [facility] Glory Buff, NP) to see him again next week. Will repeat ProBNP and BMP at that time.   Loel Dubonnet, NP

## 2018-07-29 NOTE — Telephone Encounter (Signed)
-----   Message from Loel Dubonnet, NP sent at 07/29/2018  8:14 AM EDT ----- Please call the patient and inform of results.   ProBNP shows that he has extra fluid from his heart failure. This was expected and we will likely recheck at his follow up in 3 weeks. It should improve with the Lasix (fluid pills) he is taking. His kidney function and electrolytes are all normal!  Please ask him how he is feeling regarding his shortness of breath and swelling. We may need to further adjust his diuretics prior to his next appointment.   Thank you!

## 2018-08-07 LAB — BASIC METABOLIC PANEL
BUN/Creatinine Ratio: 22 (ref 10–24)
BUN: 20 mg/dL (ref 8–27)
CO2: 19 mmol/L — ABNORMAL LOW (ref 20–29)
Calcium: 9 mg/dL (ref 8.6–10.2)
Chloride: 103 mmol/L (ref 96–106)
Creatinine, Ser: 0.92 mg/dL (ref 0.76–1.27)
GFR calc Af Amer: 94 mL/min/{1.73_m2} (ref >59)
GFR calc non Af Amer: 82 mL/min/{1.73_m2} (ref >59)
Glucose: 113 mg/dL — ABNORMAL HIGH (ref 65–99)
Potassium: 4.6 mmol/L (ref 3.5–5.2)
Sodium: 139 mmol/L (ref 134–144)

## 2018-08-07 LAB — PRO B NATRIURETIC PEPTIDE: NT-Pro BNP: 7872 pg/mL — ABNORMAL HIGH (ref 0–376)

## 2018-08-09 NOTE — Progress Notes (Signed)
Labs reviewed with normal kidney function and normal electrolytes.   Triage was asked to call the patient to check on his symptoms. Asked him to take a 2nd Lasix tablet at lunchtime for two days due to increased ProBNP. He has a follow up appointment Friday with Dr. Acie Fredrickson.

## 2018-08-10 ENCOUNTER — Telehealth: Payer: Self-pay | Admitting: Licensed Clinical Social Worker

## 2018-08-10 NOTE — Telephone Encounter (Signed)
CSW received referral from [facility] to assist patient with resources for food and follow up on insurance. CSW contacted patient and message left for return call. Raquel Sarna, [place], [place]

## 2018-08-11 ENCOUNTER — Telehealth: Payer: Self-pay | Admitting: Cardiovascular Disease

## 2018-08-11 NOTE — Telephone Encounter (Signed)
New Message ° ° ° °Left message to confirm appt and answer covid questions  °

## 2018-08-12 ENCOUNTER — Telehealth: Payer: Self-pay

## 2018-08-12 NOTE — Telephone Encounter (Signed)
[facility] Multiple attempts to reach patient to conduct visit 7/16 without success. Contacted family for assist in communication. Will keep trying

## 2018-08-13 ENCOUNTER — Ambulatory Visit: Payer: Self-pay | Admitting: Cardiovascular Disease

## 2018-08-13 NOTE — Progress Notes (Signed)
Attempt to reach to verified Med adherence and complete vitals signs. No contact despite multiple calls and attempting to reach thru family.  We will continue to contact.

## 2018-08-13 NOTE — Progress Notes (Signed)
Home visit today at 230p Met with patient on porch at his request Patient was due to be seen yesterday however we were unable to reach him. Patient reports he lost phone--found now. Was due for MD f/u appt however was unable to secure transportation.   Patient reports he is 110% better with the increase in lasix Wed and Thursday. Has not taken his lasix today. Reports he has taken other medications.  Slept better last night states he could breathe better and feels like the 'cold he had in his chest is gone.  Back pain remains unchanged.  Talked with patient that what he is describing is the lasix pulling off the fluids and he really needs to take as prescribed.   97.5 88-99 (tachy when standing) 95-97 % O2 sat  Scale did not read accurately on concrete   Noticeable swelling to feet and to knees bilaterally. Patient states edema is improved and able to wiggle toes unable to wear shoes RLE 29 cm LLE 32 ccm  1. Food insecurity-unable to prepare meals for self. Previously reported he used meal delivery. Referral made previously to SW for [employer]. Referral placed to [employer] 6 month wait list noted. Patient given [employer] number to call to arrange meals. [phone] . Coached to call me if needing help to arrange. Private pay meals are $6.99 a piece. Patient is okay with that.  2. Medication adherence-Can state medication regimen and states he is compliant. Stated would take his lasix after I left.  3. Transportation  Patient has car however does not drive it distances due to cataracts. Has difficulty ambulating due to back pain.  Message left with [facility] [phone] to inquire about referral process.  4. Inability to manage lawn Gave patient number to [facility] [phone] Patient needs to call himself to arrange 5. Social isolation Has brother and has a wife from which is has long been separated.  6. Referral to SW for any other  resources.

## 2018-08-17 NOTE — Progress Notes (Signed)
Next visit at the home with patient will be Thursday 08/19/2018 at 2 pm Will call SW from patients home to help with securing medical coverage and follow up on referrals.

## 2018-08-17 NOTE — Progress Notes (Addendum)
Late entry from 5pm 08/16/2018 Call yesterday from [employer] transport [phone] Referral process is for client to fill out application.  Will send packet to patient in mail to fill out Called patient and lvmt would like to revisit this week to help facilitate referrals.   Plan: Patient likely will require help with referrals out.  Will schedule time with patient this week and check availability of SW to attend via phone while I am at house.    

## 2018-08-17 NOTE — Progress Notes (Signed)
Late entry from 5pm 08/16/2018 Call yesterday from [employer] transport [phone] Referral process is for client to fill out application.  Will send packet to patient in mail to fill out Called patient and lvmt would like to revisit this week to help facilitate referrals.   Plan: Patient likely will require help with referrals out.  Will schedule time with patient this week and check availability of SW to attend via phone while I am at house.

## 2018-08-19 NOTE — Progress Notes (Signed)
Met with patient on porch as requested at 2pm Patient noticeably SOB after presenting from house to porch, has not taken his medications as of yet today Had BBQ last pm for dinner 168/88 90 O2 sat initally re-measured after rest to 95 Pulse 61 Resp 32 then back down to 20-24 without talking Feels like the 'congestion' is back in his chest. He doesn't make the connection that perhaps it is fluid related. Feet remain very edematous and noted edema to back of thighs.  He sleeps very poorly at 2 hr increments. Reports he does get in bed but doesn't stay there.  He has been struggling just walking in house since last visit.  Patient doesn't fully connect diet/fluid to symptoms Was only able to meet for 30 min before his back started to bother him and wanting to return inside Orders received via [facility] to NP in clinic Laurann Montana) advised to increase lasix to 58m BID once in am and then another around lunchtime. Labs in 1 wk.   Consult palliative care  Call placed to patient to advise. Also if patient worsens to call 911. Patient reluctant to due to anticipated cost. Will make another trip to patient's home to ensure understanding and adherence at 3pm 7/24  Patient struggling to manage home, meds although wife (separated) and brother are offering to assist. Encouraged patient to accept help. Inside house (view from door) shows much trash and clutter. Patient is distressed to allow anyone in due to how bad it is. Reaffirmed no judgement but we need to get someone to help him, because he needs it.   SW telephonic consult held due to patient's SOB.  Patient has contacted lawn service as well as Mom[employer] meals. Work still pending.  No application has been received yet for medical transport.  Will f/u

## 2018-08-20 NOTE — Progress Notes (Signed)
Was scheduled to see patient at 3pm for f/u from yesterday for med compliance and breathing status but patient requests to reschedule to next week. States breathing is better than yesterday, he is just tired today. Confirmed per patient he did take his Lasix yesterday and took 1 dose of lasix today and knows he has another to take today then 2 doses every day until next labs. Will f/u visit on Tuesday for medication f/u. Anticipating orders for labs towards end of week. Last weight 221 7/23

## 2018-08-24 NOTE — Progress Notes (Unsigned)
Patient returned call Patient still complains of SOB and not sleeping well.  He is NOT taking lasix 2 x a day and has not yet taken his lasix Thinks he needs oxygen Talked thru ways patient could take both doses of lasix  He wakes early at 6am lays back down at 10a and then is awake until the afternoon most days. Nods off throughout the day He will take his lasix right now and will take another later today.  Does not make the connection yet that his sleep and fluid volume overload is related.  I will see him Thursday at 2pm for labs and assessment. Will send message to C Walker to see if both doses could be taken at same time to encourage adherence.

## 2018-08-24 NOTE — Progress Notes (Signed)
Left VM yesterday afternoon requesting call back around 314pm.  No response as of yet today. Left another VM requesting call back. Need medication/status update. Anticipating need for lab drawn end of week.  Will drive by if no return call today.

## 2018-08-25 NOTE — Progress Notes (Signed)
Late entry for 08/24/2018 227pm Left VM to patient Okay to take both lasix doses together if that helps with adherence If SOB continues or worsens and/or has any chest pain, patient needs to call 911.  Will f/u with home visit on Thursday at 2pm with labs

## 2018-08-25 NOTE — Progress Notes (Signed)
Called placed and returned from patient  Patient agreeable to f/u visit tomorrow 3pm admits to only today starting the increased dose of lasix. States breathing is much better after lasix.  Will schedule labs out 5-7 days for BMP.

## 2018-08-27 ENCOUNTER — Telehealth (HOSPITAL_COMMUNITY): Payer: Self-pay | Admitting: Licensed Clinical Social Worker

## 2018-08-27 ENCOUNTER — Telehealth: Payer: Self-pay | Admitting: Licensed Clinical Social Worker

## 2018-08-27 NOTE — Telephone Encounter (Signed)
CSW picked up food for patient at [facility] and took to his house.  Placed food on doorstep and confirmed pt was home while maintaining social distancing.  Pt very appreciative of assistance.  CSW will continue to follow and assist as needed  Jorge Ny, [facility] Desk#: [phone] Cell#: [phone]

## 2018-08-27 NOTE — Telephone Encounter (Signed)
CSW received referral from Winifred Olive to assist with food and transportation. CSW referred patient to [employer] and food box will be delivered to patient's home. CSW spoke with patient who states he is feeling much better today since increase in torsemide and states "I don't want to go to the hospital". Patient verbalizes understanding of need to make appointment for follow up with the office. CSW assured patient that assistance with transportation to appointment and CSW will meet patient there to further assist with additional SDOH concerns. Patient grateful and verbalizes understanding. CSW will continue to follow. Raquel Sarna, [place], [place]

## 2018-09-01 ENCOUNTER — Telehealth: Payer: Self-pay | Admitting: Family

## 2018-09-01 DIAGNOSIS — I350 Nonrheumatic aortic (valve) stenosis: Secondary | ICD-10-CM

## 2018-09-01 DIAGNOSIS — I482 Chronic atrial fibrillation, unspecified: Secondary | ICD-10-CM

## 2018-09-01 DIAGNOSIS — I5023 Acute on chronic systolic (congestive) heart failure: Secondary | ICD-10-CM

## 2018-09-01 NOTE — Telephone Encounter (Signed)
[place] Visit Follow Up Request   Date of Request ([place]):  September 01, 2018  Requesting Provider:  Laurann Montana, NP    Agency Requested:    [facility] Contact:  Glory Buff, NP [address] [place], [place] [address] Phone #:  [phone] Fax #:  [phone]  Patient Demographic Information: Name:  Samuel [name] Age:  73 y.o.   DOB:  16-Jun-1945  MRN:  [id number]   Home visit progress note(s), lab results, telemetry strips, etc were reviewed.  Provider Recommendations: Repeat exam. Assessment of heart failure volume status. Education regarding heart failure management include fluid restriction, sodium restriction, elevation of lower extremities.   Repeat BMP after diuretic dose recently increased.  (Last labs 08/06/18 with creatinine 0.92, GFR 82)  Follow up home services requested:  Vital Signs (BP, Pulse, O2, Weight)  Physical Exam  Medication Reconciliation  Labs:  BMP  YES - All labs ordered for this home visit have been released and the request was sent to Chrissie Noa at [facility].

## 2018-09-02 ENCOUNTER — Telehealth: Payer: Self-pay | Admitting: Licensed Clinical Social Worker

## 2018-09-02 ENCOUNTER — Encounter: Payer: Self-pay | Admitting: Family

## 2018-09-02 NOTE — Telephone Encounter (Signed)
CSW received message that patient is in need of food as some of the food package delivered last week spoiled because he did not refrigerate it. CSW will explore options with other food pantries and report back to referral source. Raquel Sarna, [place], [place]

## 2018-09-03 ENCOUNTER — Telehealth: Payer: Self-pay | Admitting: *Deleted

## 2018-09-03 LAB — BASIC METABOLIC PANEL
BUN/Creatinine Ratio: 9 — ABNORMAL LOW (ref 10–24)
BUN: 9 mg/dL (ref 8–27)
CO2: 24 mmol/L (ref 20–29)
Calcium: 8.8 mg/dL (ref 8.6–10.2)
Chloride: 100 mmol/L (ref 96–106)
Creatinine, Ser: 0.98 mg/dL (ref 0.76–1.27)
GFR calc Af Amer: 87 mL/min/{1.73_m2} (ref >59)
GFR calc non Af Amer: 76 mL/min/{1.73_m2} (ref >59)
Glucose: 122 mg/dL — ABNORMAL HIGH (ref 65–99)
Potassium: 3.9 mmol/L (ref 3.5–5.2)
Sodium: 139 mmol/L (ref 134–144)

## 2018-09-03 NOTE — Progress Notes (Signed)
Call placed to patient with next in home visit will be Wednesday Aug 12th at 1pm with SW to help address SODH.  Continue to take lasix as directed. Refills called in by NP.

## 2018-09-03 NOTE — Telephone Encounter (Signed)
Call placed to pt re: lab results, left a message for pt to call back. (send call to [facility] if I'm not available to take call) 

## 2018-09-07 ENCOUNTER — Telehealth: Payer: Self-pay | Admitting: Licensed Clinical Social Worker

## 2018-09-07 NOTE — Telephone Encounter (Signed)
Patient returned call after speaking with the insurance counselor stating that it appears he may be over income for the extra help program. Patient will have to wait for the Medicare open Enrollment before applying for Medicare otherwise he will pay a penalty. During the conversation, patient asked "can someone put me in a rest home against my will". CSW explained patient's rights and as long as he is competent it is solely his decision.  CSW assured patient that our aim is to keep patient's happy, healthy and at home and not to pursue any type of placement unless requested or absolutely neccessary . Patient grateful for the assistance and states he will stay in touch with this CSW for future needs. Raquel Sarna, [place], [place]

## 2018-09-07 NOTE — Telephone Encounter (Signed)
CSW received call from patient stating "no need for home visit as we can talk on the phone". CSW informed by Winifred Olive that patient was fearful that CSW would place patient in a rest home. Patient reports that he could not afford the premium for Medicare and states "now I have the money but not sure how to go about it". CSW discussed possible options and assistance with premiums if eligible. Patient will follow up with [facility] and explore option for Medicare and return call to [place] after follow up with insurance counseling. Patient grateful for the call and verbalizes follow up for further assistance with CSW. Raquel Sarna, [place], [place]

## 2018-09-07 NOTE — Progress Notes (Signed)
Received call from patient earlier today who was concerned about the patient visit scheduled at 1pm with SW to assist in person. Patient felt the time was too early for patient and didn't feel like he needed to be seen given his breathing and swelling were both better.  Patient concerned about purpose of SW visit. He felt as though he was being evaled for a nursing home. Explained we were simply assisting him to apply for insurance to cover his medical and prescription costs. Patient will call SW instead (given number) and SW has connected with patient and given Medicare number to follow up.  Will check in with patient next week to see if he will consent to a visit.

## 2018-09-15 NOTE — Progress Notes (Signed)
Call placed to patient to schedule home visit for 09/16/2018 Patient not feeling well today, h/a and stomach upset. Has not taken meds today.  No complaints of sob admits to some nausea. Doesn't feel like a visit tomorrow.  Agreeable to a phone call for follow-up. Requesting help with food. Message sent to SW for assistance. Will continue to pursue more food options for patient.

## 2018-09-16 ENCOUNTER — Telehealth: Payer: Self-pay | Admitting: Licensed Clinical Social Worker

## 2018-09-16 NOTE — Telephone Encounter (Signed)
CSW attempted to reach patient to discuss food pantry options as he is low on food. Message left for return call.  CSW contacted Winifred Olive who referred patient to inform and discussed challenges of getting patient to pantry due to mobility. Pam shared patient will begin [employer] next week through [facility] program and in the meantime will explore options to deliver prepared meals to bridge the gap until [employer] begins next week. Raquel Sarna, [place], [place]

## 2018-09-16 NOTE — Progress Notes (Signed)
Received approval for 4 wks of [employer] to begin 8/27.  Discussed with SW and best option would be to solve for this week and until delivery is received. Left message for CB from patient reference same.
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
## 2018-10-10 NOTE — Progress Notes (Deleted)
Cardiology Office Note    Date:  10/10/2018   ID:  Samuel [name], DOB 1945/08/29, MRN [id number]  PCP:  Patient, No Pcp Per  Cardiologist: Dr. Acie Fredrickson  Chief Complaint: Volume overload  History of Present Illness:   Samuel [name] is a 73 y.o. male with a hx of HTN, chronic systolic CHF, atrial fibrillation, moderate aortic stenosis, mild AR and mild MR seen for volume overload.  Echo 12/18/2015 with EF 30-35%, LV with moderate concentric hypertrophy, moderate diffuse hypokinesis, AV mild regurgitation, MV mild regurgitation, moderate AS, LA severely dilated, RV mildly reduced systolic function, RA mildly dilate.  Seen by APP 07/22/2018 due to volume overload. Not taken xarelto due to cost. Diuretics adjusted. Cost is big issue.    Past Medical History:  Diagnosis Date  . Atrial fibrillation (HCC)    hx of atrial fibrilation  . CHF (congestive heart failure) ([place])     Past Surgical History:  Procedure Laterality Date  . FINGER SURGERY    . VASECTOMY      Current Medications: Prior to Admission medications   Medication Sig Start Date End Date Taking? Authorizing Provider  furosemide (LASIX) 40 MG tablet Take 1 tablet (40 mg total) by mouth daily. 07/22/18   Loel Dubonnet, NP  metoprolol tartrate (LOPRESSOR) 50 MG tablet Take 1.5 tablets (75 mg total) by mouth 2 (two) times daily. 07/22/18   Loel Dubonnet, NP  potassium chloride SA (K-DUR) 20 MEQ tablet Take 1 tablet (20 mEq total) by mouth 2 (two) times daily. 07/22/18   Loel Dubonnet, NP    Allergies:   Patient has no known allergies.   Social History   Socioeconomic History  . Marital status: Single    Spouse name: Not on file  . Number of children: 2  . Years of education: Not on file  . Highest education level: Not on file  Occupational History  . Occupation: retired  Scientific laboratory technician  . Financial resource strain: Not on file  . Food insecurity    Worry: Not on file    Inability: Not on file  .  Transportation needs    Medical: Not on file    Non-medical: Not on file  Tobacco Use  . Smoking status: Current Every Day Smoker    Types: Pipe  . Smokeless tobacco: Never Used  . Tobacco comment: pipe 3 times /day  Substance and Sexual Activity  . Alcohol use: No  . Drug use: No  . Sexual activity: Not on file  Lifestyle  . Physical activity    Days per week: Not on file    Minutes per session: Not on file  . Stress: Not on file  Relationships  . Social Herbalist on phone: Not on file    Gets together: Not on file    Attends religious service: Not on file    Active member of club or organization: Not on file    Attends meetings of clubs or organizations: Not on file    Relationship status: Not on file  Other Topics Concern  . 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    Lost a daughter to an accident   He lives alone with his 2 dogs. His Son lives in [place]. He has 3 grandchildren.     Family History:  The patient's family history includes Alcohol abuse in his father; Arrhythmia in his father; Heart disease  in his father. ***  ROS:   Please see the history of present illness.    ROS All other systems reviewed and are negative.   PHYSICAL EXAM:   VS:  There were no vitals taken for this visit.   GEN: Well nourished, well developed, in no acute distress  HEENT: normal  Neck: no JVD, carotid bruits, or masses Cardiac: ***RRR; no murmurs, rubs, or gallops,no edema  Respiratory:  clear to auscultation bilaterally, normal work of breathing GI: soft, nontender, nondistended, + BS MS: no deformity or atrophy  Skin: warm and dry, no rash Neuro:  Alert and Oriented x 3, Strength and sensation are intact Psych: euthymic mood, full affect  Wt Readings from Last 3 Encounters:  07/22/18 220 lb 6.4 oz (100 kg)  11/16/17 229 lb (103.9 kg)  03/24/16 231 lb 9.6 oz (105.1 kg)      Studies/Labs Reviewed:   EKG:  EKG is  ordered today.  The ekg ordered today demonstrates ***  Recent Labs: 12/02/2017: Hemoglobin 16.4; Platelets 199 08/06/2018: NT-Pro BNP 7,872 09/02/2018: BUN 9; Creatinine, Ser 0.98; Potassium 3.9; Sodium 139   Lipid Panel    Component Value Date/Time   CHOL 157 11/19/2015 1525   TRIG 115 11/19/2015 1525   HDL 27 (L) 11/19/2015 1525   CHOLHDL 5.8 (H) 11/19/2015 1525   VLDL 23 11/19/2015 1525   LDLCALC 107 11/19/2015 1525    Additional studies/ records that were reviewed today include:   Echocardiogram: 11/2015 Study Conclusions  - Left ventricle: The cavity size was normal. There was moderate   concentric hypertrophy. Systolic function was moderately to   severely reduced. The estimated ejection fraction was in the   range of 30% to 35%. Moderate diffuse hypokinesis with no   identifiable regional variations. - Aortic valve: There was mild regurgitation. - Mitral valve: There was mild regurgitation. - Left atrium: The atrium was severely dilated. - Right ventricle: Systolic function was mildly reduced. - Right atrium: The atrium was mildly dilated.  Impressions:  - Evaluation of the aortic valve is impaired by poor image quality   and rapid irregular rhythm. Howver, there is suggestion of   possible severe aortic stenosis with low gradient. After a longer   diastole, the peak aortic gradient at times exceeds 60 mm Hg.   Also consider a component of tachycardia related cardiomyopathy.   ASSESSMENT & PLAN:    1. ***    Medication Adjustments/Labs and Tests Ordered: Current medicines are reviewed at length with the patient today.  Concerns regarding medicines are outlined above.  Medication changes, Labs and Tests ordered today are listed in the Patient Instructions below. There are no Patient Instructions on file for this visit.   Lorelei PontSigned, [place], [place]  10/10/2018 10:23 PM    [facility] [address], [place], [place]  [address]  Phone: [phone]; Fax: [phone]

## 2018-10-11 ENCOUNTER — Ambulatory Visit: Payer: Self-pay | Admitting: Physician Assistant

## 2018-11-11 ENCOUNTER — Ambulatory Visit: Payer: Self-pay | Admitting: Cardiovascular Disease

## 2018-11-24 ENCOUNTER — Telehealth: Payer: Self-pay | Admitting: Licensed Clinical Social Worker

## 2018-11-24 NOTE — Telephone Encounter (Signed)
CSW has attempted several times to reach patient to follow up and provide some community resources for housing repairs. Messages left with no return call. CSW available pending return call from patient. Samuel [name], Samuel [name], Samuel [name]

## 2019-02-08 ENCOUNTER — Other Ambulatory Visit: Payer: Self-pay

## 2019-02-08 ENCOUNTER — Encounter: Payer: Self-pay | Admitting: Cardiovascular Disease

## 2019-02-08 ENCOUNTER — Telehealth: Payer: Self-pay | Admitting: *Deleted

## 2019-02-08 ENCOUNTER — Telehealth (INDEPENDENT_AMBULATORY_CARE_PROVIDER_SITE_OTHER): Payer: Self-pay | Admitting: Cardiovascular Disease

## 2019-02-08 VITALS — Ht 69.0 in | Wt 175.0 lb

## 2019-02-08 DIAGNOSIS — I482 Chronic atrial fibrillation, unspecified: Secondary | ICD-10-CM

## 2019-02-08 DIAGNOSIS — Z7189 Other specified counseling: Secondary | ICD-10-CM

## 2019-02-08 DIAGNOSIS — I5022 Chronic systolic (congestive) heart failure: Secondary | ICD-10-CM

## 2019-02-08 DIAGNOSIS — I350 Nonrheumatic aortic (valve) stenosis: Secondary | ICD-10-CM

## 2019-02-08 DIAGNOSIS — R0602 Shortness of breath: Secondary | ICD-10-CM

## 2019-02-08 MED ORDER — FUROSEMIDE 40 MG PO TABS: 40.0000 mg | ORAL_TABLET | Freq: Two times a day (BID) | ORAL | 3 refills | Status: AC

## 2019-02-08 NOTE — Telephone Encounter (Signed)
**Note Samuel-Identified via Obfuscation** ..   Virtual Visit Pre-Appointment Phone Call  "(Name), I am calling you today to discuss your upcoming appointment. We are currently trying to limit exposure to the virus that causes COVID-19 by seeing patients at home rather than in the office."  1. "What is the BEST phone number to call the day of the visit?" - include this in appointment notes  2. "Do you have or have access to (through a family member/friend) a smartphone with video capability that we can use for your visit?" a. If yes - list this number in appt notes as "cell" (if different from BEST phone #) and list the appointment type as a VIDEO visit in appointment notes b. If no - list the appointment type as a PHONE visit in appointment notes  3. Confirm consent - "In the setting of the current Covid19 crisis, you are scheduled for a (phone or video) visit with your provider on (date) at (time).  Just as we do with many in-office visits, in order for you to participate in this visit, we must obtain consent.  If you'd like, I can send this to your mychart (if signed up) or email for you to review.  Otherwise, I can obtain your verbal consent now.  All virtual visits are billed to your insurance company just like a normal visit would be.  By agreeing to a virtual visit, we'd like you to understand that the technology does not allow for your provider to perform an examination, and thus may limit your provider's ability to fully assess your condition. If your provider identifies any concerns that need to be evaluated in person, we will make arrangements to do so.  Finally, though the technology is pretty good, we cannot assure that it will always work on either your or our end, and in the setting of a video visit, we may have to convert it to a phone-only visit.  In either situation, we cannot ensure that we have a secure connection.  Are you willing to proceed?" STAFF: Did the patient verbally acknowledge consent to telehealth visit? Document  YES/NO here: YES  4. Advise patient to be prepared - "Two hours prior to your appointment, go ahead and check your blood pressure, pulse, oxygen saturation, and your weight (if you have the equipment to check those) and write them all down. When your visit starts, your provider will ask you for this information. If you have an Apple Watch or Kardia device, please plan to have heart rate information ready on the day of your appointment. Please have a pen and paper handy nearby the day of the visit as well."  5. Give patient instructions for MyChart download to smartphone OR Doximity/Doxy.me as below if video visit (depending on what platform provider is using)  6. Inform patient they will receive a phone call 15 minutes prior to their appointment time (may be from unknown caller ID) so they should be prepared to answer    TELEPHONE CALL NOTE  Samuel [name] has been deemed a candidate for a follow-up tele-health visit to limit community exposure during the Covid-19 pandemic. I spoke with the patient via phone to ensure availability of phone/video source, confirm preferred email & phone number, and discuss instructions and expectations.  I reminded Samuel [name] to be prepared with any vital sign and/or heart rhythm information that could potentially be obtained via home monitoring, at the time of his visit. I reminded Samuel [name] to expect a phone call prior  to his visit.  Juventino Slovak, CMA 02/08/2019 2:08 PM   INSTRUCTIONS FOR DOWNLOADING THE MYCHART APP TO SMARTPHONE  - The patient must first make sure to have activated MyChart and know their login information - If Apple, go to [employer] and type in MyChart in the search bar and download the app. If Android, ask patient to go to [employer] and type in [facility] in the search bar and download the app. The app is free but as with any other app downloads, their phone may require them to verify saved payment information or Apple/Android  password.  - The patient will need to then log into the app with their MyChart username and password, and select [facility] as their healthcare provider to link the account. When it is time for your visit, go to the MyChart app, find appointments, and click Begin Video Visit. Be sure to Select Allow for your device to access the Microphone and Camera for your visit. You will then be connected, and your provider will be with you shortly.  **If they have any issues connecting, or need assistance please contact MyChart service desk [phone])**  **If using a computer, in order to ensure the best quality for their visit they will need to use either of the following Internet Browsers: [employer], or Google Chrome**  IF USING DOXIMITY or DOXY.ME - The patient will receive a link just prior to their visit by text.     FULL LENGTH CONSENT FOR TELE-HEALTH VISIT   I hereby voluntarily request, consent and authorize [facility] and its employed or contracted physicians, physician assistants, nurse practitioners or other licensed health care professionals (the Practitioner), to provide me with telemedicine health care services (the "Services") as deemed necessary by the treating Practitioner. I acknowledge and consent to receive the Services by the Practitioner via telemedicine. I understand that the telemedicine visit will involve communicating with the Practitioner through live audiovisual communication technology and the disclosure of certain medical information by electronic transmission. I acknowledge that I have been given the opportunity to request an in-person assessment or other available alternative prior to the telemedicine visit and am voluntarily participating in the telemedicine visit.  I understand that I have the right to withhold or withdraw my consent to the use of telemedicine in the course of my care at any time, without affecting my right to future care or treatment,  and that the Practitioner or I may terminate the telemedicine visit at any time. I understand that I have the right to inspect all information obtained and/or recorded in the course of the telemedicine visit and may receive copies of available information for a reasonable fee.  I understand that some of the potential risks of receiving the Services via telemedicine include:  Marland Kitchen Delay or interruption in medical evaluation due to technological equipment failure or disruption; . Information transmitted may not be sufficient (e.g. poor resolution of images) to allow for appropriate medical decision making by the Practitioner; and/or  . In rare instances, security protocols could fail, causing a breach of personal health information.  Furthermore, I acknowledge that it is my responsibility to provide information about my medical history, conditions and care that is complete and accurate to the best of my ability. I acknowledge that Practitioner's advice, recommendations, and/or decision may be based on factors not within their control, such as incomplete or inaccurate data provided by me or distortions of diagnostic images or specimens that may result from electronic  transmissions. I understand that the practice of medicine is not an exact science and that Practitioner makes no warranties or guarantees regarding treatment outcomes. I acknowledge that I will receive a copy of this consent concurrently upon execution via email to the email address I last provided but may also request a printed copy by calling the office of [facility].    I understand that my insurance will be billed for this visit.   I have read or had this consent read to me. . I understand the contents of this consent, which adequately explains the benefits and risks of the Services being provided via telemedicine.  . I have been provided ample opportunity to ask questions regarding this consent and the Services and have had my questions  answered to my satisfaction. . I give my informed consent for the services to be provided through the use of telemedicine in my medical care  By participating in this telemedicine visit I agree to the above.

## 2019-02-08 NOTE — Progress Notes (Signed)
Virtual Visit via Telephone Note   This visit type was conducted due to national recommendations for restrictions regarding the COVID-19 Pandemic (e.g. social distancing) in an effort to limit this patient's exposure and mitigate transmission in our community.  Due to his co-morbid illnesses, this patient is at least at moderate risk for complications without adequate follow up.  This format is felt to be most appropriate for this patient at this time.  The patient did not have access to video technology/had technical difficulties with video requiring transitioning to audio format only (telephone).  All issues noted in this document were discussed and addressed.  No physical exam could be performed with this format.  Please refer to the patient's chart for his  consent to telehealth for [facility].   Date:  02/08/2019   ID:  Samuel [name], DOB August 27, 1945, MRN [id number]  Patient Location: Home Provider Location: Office  PCP:  Patient, No Pcp Per  Cardiologist:  Kristeen Miss, MD  Electrophysiologist:  None   Evaluation Performed:  Follow-Up Visit  Chief Complaint:  Dyspnea   History of Present Illness:    Samuel [name] is a 74 y.o. male with  Chronic systolic CHF and chronic AFib.  Still smoking  Now becomes very short of breath and has a cold feeling across his chest  Last echo in 2017 shows severe LV dysfunction with EF 30-35%. , mild AI, mild MR  Thee is a question of severe AS - difficult to image / difficult  to measure    Eats meals on wheels   The patient does not have symptoms concerning for COVID-19 infection (fever, chills, cough, or new shortness of breath).    Past Medical History:  Diagnosis Date  . Atrial fibrillation (HCC)    hx of atrial fibrilation  . CHF (congestive heart failure) (HCC)    Past Surgical History:  Procedure Laterality Date  . FINGER SURGERY    . VASECTOMY       Current Meds  Medication Sig  . aspirin EC 81 MG tablet Take 81 mg by  mouth daily.  . furosemide (LASIX) 40 MG tablet Take 1 tablet (40 mg total) by mouth daily.  . metoprolol tartrate (LOPRESSOR) 50 MG tablet Take 50 mg by mouth 2 (two) times daily.  . potassium chloride SA (K-DUR) 20 MEQ tablet Take 1 tablet (20 mEq total) by mouth 2 (two) times daily.     Allergies:   Patient has no known allergies.   Social History   Tobacco Use  . Smoking status: Current Every Day Smoker    Types: Pipe  . Smokeless tobacco: Never Used  . Tobacco comment: pipe 3 times /day  Substance Use Topics  . Alcohol use: No  . Drug use: No     Family Hx: The patient's family history includes Alcohol abuse in his father; Arrhythmia in his father; Heart disease in his father.  ROS:   Please see the history of present illness.     All other systems reviewed and are negative.   Prior CV studies:   The following studies were reviewed today:    Labs/Other Tests and Data Reviewed:    EKG:  No ECG reviewed.  Recent Labs: 08/06/2018: NT-Pro BNP 7,872 09/02/2018: BUN 9; Creatinine, Ser 0.98; Potassium 3.9; Sodium 139   Recent Lipid Panel Lab Results  Component Value Date/Time   CHOL 157 11/19/2015 03:25 PM   TRIG 115 11/19/2015 03:25 PM   HDL 27 (L) 11/19/2015  03:25 PM   CHOLHDL 5.8 (H) 11/19/2015 03:25 PM   LDLCALC 107 11/19/2015 03:25 PM    Wt Readings from Last 3 Encounters:  02/08/19 175 lb (79.4 kg)  07/22/18 220 lb 6.4 oz (100 kg)  11/16/17 229 lb (103.9 kg)     Objective:    Vital Signs:  Ht 5\' 9"  (1.753 m)   Wt 175 lb (79.4 kg)   BMI 25.84 kg/m      ASSESSMENT & PLAN:    1. Acute on Chronic systolic CHF:   Has worsening dyspnea.  Advised him to watch his salt,  Double his lasix and potassium   COVID-19 Education: The signs and symptoms of COVID-19 were discussed with the patient and how to seek care for testing (follow up with PCP or arrange E-visit).  The importance of social distancing was discussed today.  Time:   Today, I have spent   17  minutes with the patient with telehealth technology discussing the above problems.     Medication Adjustments/Labs and Tests Ordered: Current medicines are reviewed at length with the patient today.  Concerns regarding medicines are outlined above.   Tests Ordered: No orders of the defined types were placed in this encounter.   Medication Changes: No orders of the defined types were placed in this encounter.   Follow Up:  In Person in 6 week(s)  Signed, Mertie Moores, MD  02/08/2019 2:28 PM    [facility]

## 2019-02-08 NOTE — Patient Instructions (Signed)
Medication Instructions:  Your physician has recommended you make the following change in your medication:  INCREASE Furosemide (Lasix) to 40 mg twice daily  INCREASE KDur (Potassium Chloride) to 20 mEq twice daily  *If you need a refill on your cardiac medications before your next appointment, please call your pharmacy*  Lab Work: None Ordered If you have labs (blood work) drawn today and your tests are completely normal, you will receive your results only by: Marland Kitchen MyChart Message (if you have MyChart) OR . A paper copy in the mail If you have any lab test that is abnormal or we need to change your treatment, we will call you to review the results.   Testing/Procedures: Your physician has requested that you have an echocardiogram. Echocardiography is a painless test that uses sound waves to create images of your heart. It provides your doctor with information about the size and shape of your heart and how well your heart's chambers and valves are working. This procedure takes approximately one hour. There are no restrictions for this procedure.    Follow-Up: At [facility], you and your health needs are our priority.  As part of our continuing mission to provide you with exceptional heart care, we have created designated Provider Care Teams.  These Care Teams include your primary Cardiologist (physician) and Advanced Practice Providers (APPs -  Physician Assistants and Nurse Practitioners) who all work together to provide you with the care you need, when you need it.  Your next appointment:   6 week(s) on Tuesday March 9 at 1:45 pm   The format for your next appointment:   In Person  Provider:   Tereso Newcomer, PA-C

## 2019-02-15 ENCOUNTER — Encounter (HOSPITAL_COMMUNITY): Payer: Self-pay | Admitting: Interventional Cardiology

## 2019-02-15 ENCOUNTER — Emergency Department (HOSPITAL_COMMUNITY): Payer: Medicare Other

## 2019-02-15 ENCOUNTER — Other Ambulatory Visit: Payer: Self-pay

## 2019-02-15 ENCOUNTER — Inpatient Hospital Stay (HOSPITAL_COMMUNITY)
Admission: EM | Admit: 2019-02-15 | Discharge: 2019-02-28 | DRG: 308 | Disposition: E | Payer: Medicare Other | Attending: Interventional Cardiology | Admitting: Interventional Cardiology

## 2019-02-15 DIAGNOSIS — I080 Rheumatic disorders of both mitral and aortic valves: Secondary | ICD-10-CM | POA: Diagnosis present

## 2019-02-15 DIAGNOSIS — I462 Cardiac arrest due to underlying cardiac condition: Secondary | ICD-10-CM | POA: Diagnosis not present

## 2019-02-15 DIAGNOSIS — Z8249 Family history of ischemic heart disease and other diseases of the circulatory system: Secondary | ICD-10-CM | POA: Diagnosis not present

## 2019-02-15 DIAGNOSIS — F17290 Nicotine dependence, other tobacco product, uncomplicated: Secondary | ICD-10-CM | POA: Diagnosis present

## 2019-02-15 DIAGNOSIS — J449 Chronic obstructive pulmonary disease, unspecified: Secondary | ICD-10-CM | POA: Diagnosis present

## 2019-02-15 DIAGNOSIS — Z79899 Other long term (current) drug therapy: Secondary | ICD-10-CM

## 2019-02-15 DIAGNOSIS — Z7982 Long term (current) use of aspirin: Secondary | ICD-10-CM | POA: Diagnosis not present

## 2019-02-15 DIAGNOSIS — Z91128 Patient's intentional underdosing of medication regimen for other reason: Secondary | ICD-10-CM | POA: Diagnosis not present

## 2019-02-15 DIAGNOSIS — I5023 Acute on chronic systolic (congestive) heart failure: Secondary | ICD-10-CM | POA: Diagnosis present

## 2019-02-15 DIAGNOSIS — I9589 Other hypotension: Secondary | ICD-10-CM

## 2019-02-15 DIAGNOSIS — Z20822 Contact with and (suspected) exposure to covid-19: Secondary | ICD-10-CM | POA: Diagnosis present

## 2019-02-15 DIAGNOSIS — I472 Ventricular tachycardia: Secondary | ICD-10-CM | POA: Diagnosis not present

## 2019-02-15 DIAGNOSIS — T501X6A Underdosing of loop [high-ceiling] diuretics, initial encounter: Secondary | ICD-10-CM | POA: Diagnosis present

## 2019-02-15 DIAGNOSIS — I4821 Permanent atrial fibrillation: Secondary | ICD-10-CM

## 2019-02-15 DIAGNOSIS — Z9114 Patient's other noncompliance with medication regimen: Secondary | ICD-10-CM | POA: Diagnosis not present

## 2019-02-15 DIAGNOSIS — I5084 End stage heart failure: Secondary | ICD-10-CM | POA: Diagnosis not present

## 2019-02-15 DIAGNOSIS — I959 Hypotension, unspecified: Secondary | ICD-10-CM | POA: Diagnosis present

## 2019-02-15 DIAGNOSIS — I350 Nonrheumatic aortic (valve) stenosis: Secondary | ICD-10-CM

## 2019-02-15 DIAGNOSIS — Z452 Encounter for adjustment and management of vascular access device: Secondary | ICD-10-CM

## 2019-02-15 DIAGNOSIS — R0602 Shortness of breath: Secondary | ICD-10-CM

## 2019-02-15 DIAGNOSIS — I509 Heart failure, unspecified: Secondary | ICD-10-CM

## 2019-02-15 DIAGNOSIS — I4819 Other persistent atrial fibrillation: Secondary | ICD-10-CM | POA: Diagnosis present

## 2019-02-15 DIAGNOSIS — I4901 Ventricular fibrillation: Secondary | ICD-10-CM | POA: Diagnosis not present

## 2019-02-15 DIAGNOSIS — I110 Hypertensive heart disease with heart failure: Secondary | ICD-10-CM | POA: Diagnosis present

## 2019-02-15 DIAGNOSIS — I4891 Unspecified atrial fibrillation: Secondary | ICD-10-CM

## 2019-02-15 LAB — CBC WITH DIFFERENTIAL/PLATELET
Abs Immature Granulocytes: 0.04 10*3/uL (ref 0.00–0.07)
Basophils Absolute: 0.1 10*3/uL (ref 0.0–0.1)
Basophils Relative: 1 %
Eosinophils Absolute: 0.1 10*3/uL (ref 0.0–0.5)
Eosinophils Relative: 1 %
HCT: 46 % (ref 39.0–52.0)
Hemoglobin: 15.1 g/dL (ref 13.0–17.0)
Immature Granulocytes: 0 %
Lymphocytes Relative: 10 %
Lymphs Abs: 1 10*3/uL (ref 0.7–4.0)
MCH: 33.7 pg (ref 26.0–34.0)
MCHC: 32.8 g/dL (ref 30.0–36.0)
MCV: 102.7 fL — ABNORMAL HIGH (ref 80.0–100.0)
Monocytes Absolute: 1.4 10*3/uL — ABNORMAL HIGH (ref 0.1–1.0)
Monocytes Relative: 14 %
Neutro Abs: 7.4 10*3/uL (ref 1.7–7.7)
Neutrophils Relative %: 74 %
Platelets: 176 10*3/uL (ref 150–400)
RBC: 4.48 MIL/uL (ref 4.22–5.81)
RDW: 13.7 % (ref 11.5–15.5)
WBC: 9.9 10*3/uL (ref 4.0–10.5)
nRBC: 0.2 % (ref 0.0–0.2)

## 2019-02-15 LAB — BASIC METABOLIC PANEL
Anion gap: 12 (ref 5–15)
BUN: 30 mg/dL — ABNORMAL HIGH (ref 8–23)
CO2: 24 mmol/L (ref 22–32)
Calcium: 8.7 mg/dL — ABNORMAL LOW (ref 8.9–10.3)
Chloride: 97 mmol/L — ABNORMAL LOW (ref 98–111)
Creatinine, Ser: 1.26 mg/dL — ABNORMAL HIGH (ref 0.61–1.24)
GFR calc Af Amer: >60 mL/min (ref >60)
GFR calc non Af Amer: 55 mL/min — ABNORMAL LOW (ref >60)
Glucose, Bld: 183 mg/dL — ABNORMAL HIGH (ref 70–99)
Potassium: 4.1 mmol/L (ref 3.5–5.1)
Sodium: 133 mmol/L — ABNORMAL LOW (ref 135–145)

## 2019-02-15 LAB — BRAIN NATRIURETIC PEPTIDE: B Natriuretic Peptide: 1296.1 pg/mL — ABNORMAL HIGH (ref 0.0–100.0)

## 2019-02-15 LAB — HEPARIN LEVEL (UNFRACTIONATED): Heparin Unfractionated: 0.58 IU/mL (ref 0.30–0.70)

## 2019-02-15 LAB — SARS CORONAVIRUS 2 (TAT 6-24 HRS): SARS Coronavirus 2: NEGATIVE

## 2019-02-15 MED ORDER — DILTIAZEM HCL-DEXTROSE 125-5 MG/125ML-% IV SOLN (PREMIX)
2.5000 mg/h | INTRAVENOUS | Status: DC
  Administered 2019-02-15: 5 mg/h via INTRAVENOUS
  Administered 2019-02-15: 10 mg/h via INTRAVENOUS
  Filled 2019-02-15: qty 125

## 2019-02-15 MED ORDER — ASPIRIN EC 81 MG PO TBEC
81.0000 mg | DELAYED_RELEASE_TABLET | Freq: Every day | ORAL | Status: DC
  Administered 2019-02-15 – 2019-02-17 (×3): 81 mg via ORAL
  Filled 2019-02-15 (×3): qty 1

## 2019-02-15 MED ORDER — METOPROLOL TARTRATE 25 MG PO TABS
25.0000 mg | ORAL_TABLET | Freq: Two times a day (BID) | ORAL | Status: DC
  Administered 2019-02-15 – 2019-02-17 (×4): 25 mg via ORAL
  Filled 2019-02-15 (×4): qty 1

## 2019-02-15 MED ORDER — POTASSIUM CHLORIDE CRYS ER 20 MEQ PO TBCR
20.0000 meq | EXTENDED_RELEASE_TABLET | Freq: Two times a day (BID) | ORAL | Status: DC
  Administered 2019-02-15: 20 meq via ORAL
  Filled 2019-02-15: qty 1

## 2019-02-15 MED ORDER — SODIUM CHLORIDE 0.9% FLUSH
3.0000 mL | Freq: Two times a day (BID) | INTRAVENOUS | Status: DC
  Administered 2019-02-17 (×2): 3 mL via INTRAVENOUS

## 2019-02-15 MED ORDER — ACETAMINOPHEN 325 MG PO TABS
650.0000 mg | ORAL_TABLET | ORAL | Status: DC | PRN
  Administered 2019-02-16 (×2): 650 mg via ORAL
  Filled 2019-02-15 (×2): qty 2

## 2019-02-15 MED ORDER — ONDANSETRON HCL 4 MG/2ML IJ SOLN: 4.0000 mg | Freq: Four times a day (QID) | INTRAMUSCULAR | Status: DC | PRN

## 2019-02-15 MED ORDER — HEPARIN (PORCINE) 25000 UT/250ML-% IV SOLN
1300.0000 [IU]/h | INTRAVENOUS | Status: DC
  Administered 2019-02-15 – 2019-02-16 (×2): 1400 [IU]/h via INTRAVENOUS
  Administered 2019-02-17: 1300 [IU]/h via INTRAVENOUS
  Administered 2019-02-17: 1400 [IU]/h via INTRAVENOUS
  Filled 2019-02-15 (×4): qty 250

## 2019-02-15 MED ORDER — DIGOXIN 0.25 MG/ML IJ SOLN
0.2500 mg | Freq: Once | INTRAMUSCULAR | Status: AC
  Administered 2019-02-15: 0.25 mg via INTRAVENOUS
  Filled 2019-02-15: qty 2

## 2019-02-15 MED ORDER — ACETAMINOPHEN 500 MG PO TABS
1000.0000 mg | ORAL_TABLET | Freq: Once | ORAL | Status: AC
  Administered 2019-02-15: 23:00:00 1000 mg via ORAL
  Filled 2019-02-15: qty 2

## 2019-02-15 MED ORDER — HEPARIN BOLUS VIA INFUSION
5000.0000 [IU] | Freq: Once | INTRAVENOUS | Status: AC
  Administered 2019-02-15: 17:00:00 5000 [IU] via INTRAVENOUS
  Filled 2019-02-15: qty 5000

## 2019-02-15 MED ORDER — SODIUM CHLORIDE 0.9% FLUSH: 3.0000 mL | INTRAVENOUS | Status: DC | PRN

## 2019-02-15 MED ORDER — ZOLPIDEM TARTRATE 5 MG PO TABS
5.0000 mg | ORAL_TABLET | Freq: Every evening | ORAL | Status: DC | PRN
  Administered 2019-02-15 – 2019-02-17 (×3): 5 mg via ORAL
  Filled 2019-02-15 (×3): qty 1

## 2019-02-15 MED ORDER — FUROSEMIDE 10 MG/ML IJ SOLN
80.0000 mg | Freq: Two times a day (BID) | INTRAMUSCULAR | Status: DC
  Administered 2019-02-15 – 2019-02-17 (×4): 80 mg via INTRAVENOUS
  Filled 2019-02-15 (×4): qty 8

## 2019-02-15 MED ORDER — SODIUM CHLORIDE 0.9 % IV SOLN: 250.0000 mL | INTRAVENOUS | Status: DC | PRN

## 2019-02-15 NOTE — Progress Notes (Addendum)
   02/17/2019 2032  MEWS Assessment  Is this an acute change? No   Pt admitted with afib rvr- red previously but has been yellow, RRT and MD notified.   MD stated okay to go ahead and given PO lopressor and gave verbal order for 5mg  PO ambien at patient request.   2045-repeat BP prior to lopressor given showed 97/62.  MD stated to hold until SBP above 100.

## 2019-02-15 NOTE — Progress Notes (Signed)
ANTICOAGULATION CONSULT NOTE - Initial Consult  Pharmacy Consult for Heparin Indication: atrial fibrillation  No Known Allergies  Patient Measurements: Height: 5\' 11"  (180.3 cm) Weight: 215 lb (97.5 kg) IBW/kg (Calculated) : 75.3 Heparin Dosing Weight: 95.1  Vital Signs: Temp: 99.1 F (37.3 C) (01/19 1453) Temp Source: Oral (01/19 1453) BP: 103/84 (01/19 1630) Pulse Rate: 113 (01/19 1630)  Labs: Recent Labs    March 13, 2019 1456  HGB 15.1  HCT 46.0  PLT 176  CREATININE 1.26*    Estimated Creatinine Clearance: 60.3 mL/min (A) (by C-G formula based on SCr of 1.26 mg/dL (H)).   Medical History: Past Medical History:  Diagnosis Date  . Atrial fibrillation (HCC)    hx of atrial fibrilation  . CHF (congestive heart failure) [facility])     Assessment: 74 y.o. male presenting with past medical history of hypertension, chronic systolic CHF, chronic atrial fibrillation, noncompliance and aortic stenosis who presented with 6 weeks onset of increasing dyspnea on exertion and weakness. Patient was not on prior anticoagulation to this admission. Patient baseline CBC is within normal limits and creatinine is slightly elevated at 1.26.  Goal of Therapy:  Heparin level 0.3-0.7 units/ml Monitor platelets by anticoagulation protocol: Yes   Plan: Give 5000 unit bolus x1; then start heparin infusion at 1400 units/hr Check heparin level in 6 hours and then daily while on heparin Monitor CBC and s/sx of bleeding    61 03-13-19,4:41 PM

## 2019-02-15 NOTE — ED Notes (Signed)
Dinner Tray Ordered @ 1740. 

## 2019-02-15 NOTE — ED Triage Notes (Signed)
BIB EMS from home. Reports inc generalized weakness, SOB X several weeks. Seen yesterday and dx with afib, was started on Cardizem. Pt home health nurse called 911 for pt HR. Per EMS pt has not been off couch in several days, several jugs of urine beside him.

## 2019-02-15 NOTE — Progress Notes (Signed)
Pt with severe muscle cramps-paged MD  Verbal order given for once time dose 1000mg  PO tylenol.

## 2019-02-15 NOTE — H&P (Addendum)
The patient has been seen in conjunction with .Samuel [name], [place] All aspects of care have been considered and discussed. The patient has been personally interviewed, examined, and all clinical data has been reviewed.  Shortness of breath and lower extremity swelling. Background history of permanent atrial fibrillation, chronic systolic heart failure, refusal of chronic anticoagulation therapy, and recent reduction in diuretic regimen because it is causing him to urinate too much.  Did not take his a.m. medications.  Baseline medical regimen includes aspirin, metoprolol tartrate 50 mg twice daily, and furosemide 40 mg twice daily. Clinical data includes atrial fibrillation with rapid ventricular response despite IV diltiazem, low blood pressure around 90 mmHg systolic, no significant murmur on auscultation although systolic and diastolic murmurs have been described in the past.  Significant bilateral lower extremity edema from ankles to mid thigh. Chest x-ray demonstrates cardiomegaly and left pleural effusion. BNP is elevated, creatinine is mildly elevated, ECG demonstrates atrial fibrillation with rapid ventricular response and no acute ST-T wave change. DIAGNOSIS: 1.  Acute on chronic combined systolic and diastolic heart --> has not been compliant with twice daily furosemide regimen; 2.  Atrial fibrillation with rapid ventricular response -> possibly related to acute beta-blocker. PLAN: 1.  Continue metoprolol but at a slightly lower dose due to hypotension; 2.  Digoxin 0.25 mg IV x 1; 3.  Wean diltiazem; 4.  Full CODE STATUS; 5.  IV furosemide 80 mg every 12 hours and monitor urine output closely.     Cardiology Admission History and Physical:   Patient ID: Samuel [name] MRN: [id number]; DOB: 02-02-1945   Admission date: 2019/03/02  Primary Care Provider: Patient, No Pcp Per Primary Cardiologist: Mertie Moores, MD  Primary Electrophysiologist:  None   Chief Complaint:  dyspnea  Patient  Profile:   Samuel [name] is a 74 y.o. male with past medical history of hypertension, chronic systolic CHF, chronic atrial fibrillation, noncompliance and aortic stenosis who presented with 6 weeks onset of increasing dyspnea on exertion and weakness.  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  Patient was initially diagnosed with atrial fibrillation with RVR when he presented with heart failure symptoms in December 2015.  He was placed on heparin drip and IV Lasix along with rate control therapy.  Echocardiogram obtained on 12/29/2013 showed EF 30 to 35%, akinesis of the basal mid inferior myocardium, moderate aortic stenosis, mild to moderate MR, PA peak pressure 44 mmHg.  Unfortunately before his work-up was complete, patient left AMA.  During the hospitalization, cardiac catheterization was recommended to rule out CAD which he refused.  He was readmitted in 2017 with atrial fibrillation with RVR and volume overload.  Echocardiogram obtained on 12/18/2015 continue to show EF of 30 to 35%, mild AI, mild MR, severe LAE and diffuse hypokinesis with no identifiable regional variation.  He was most recently seen virtually by Dr. Acie Fredrickson on 02/08/2039, at which time, he described a cold-like sensation across his chest and severe dyspnea.  Dr. Acie Fredrickson recommended for the patient to double up on his Lasix (from 40mg  daily to BID) and return in 6 weeks for follow-up.  According to the patient, he has been compliant with metoprolol.  He lives by himself and the care only get around with the wheelchair.  He set up multiple chairs in the house so he can said he has a chair while  walking in the kitchen.  The only food he eat is sandwiches as it is the easiest one to make.  He also constantly drink water throughout the entire day.  Initially after his visit  with Dr. Elease Hashimoto, he did double up on his Lasix, however he only did this for 3 days before going back to the previous dose of the Lasix was making him urinate throughout the entire day.  According to the patient, she gets short of breath after walking only 3 steps.  He also endorses orthopnea and PND.  He denies any chest pain but describes cold like sensation in the chest every time he walks.  He eventually sought medical attention at [facility] on 02/02/2019.  Initial EKG showed atrial fibrillation with RVR.  Lab work include creatinine of 1.26, sodium 133, normal hemoglobin.  While in the ED, patient was placed on IV Cardizem.  Cardiology service has been consulted for A. fib with RVR and acute systolic heart failure.   Heart Pathway Score:     Past Medical History:  Diagnosis Date   Atrial fibrillation (HCC)    hx of atrial fibrilation   CHF (congestive heart failure) (HCC)     Past Surgical History:  Procedure Laterality Date   FINGER SURGERY     VASECTOMY       Medications Prior to Admission: Prior to Admission medications   Medication Sig Start Date End Date Taking? Authorizing Provider  aspirin EC 81 MG tablet Take 81 mg by mouth daily.    [provider]  furosemide (LASIX) 40 MG tablet Take 1 tablet (40 mg total) by mouth 2 (two) times daily. 02/08/19   Nahser, Deloris Ping, MD  metoprolol tartrate (LOPRESSOR) 50 MG tablet Take 50 mg by mouth 2 (two) times daily.    [provider]  potassium chloride SA (K-DUR) 20 MEQ tablet Take 1 tablet (20 mEq total) by mouth 2 (two) times daily. 07/22/18   Alver Sorrow, NP     Allergies:   No Known Allergies  Social History:   Social History   Socioeconomic History   Marital status: Single    Spouse name: Not on file   Number of children: 2   Years of education: Not on file   Highest education level: Not on file  Occupational History   Occupation: retired  Tobacco Use   Smoking status: Current Every Day  Smoker    Types: Pipe   Smokeless tobacco: Never Used   Tobacco comment: pipe 3 times /day  Substance and Sexual Activity   Alcohol use: No   Drug use: No   Sexual activity: Not on file  Other Topics Concern   Not on file  Social History Narrative   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    Lost a daughter to an accident   He lives alone with his 2 dogs. His Son lives in [place]. He has 3 grandchildren.   Social Determinants of Health   Financial Resource Strain:    Difficulty of Paying Living Expenses: Not on file  Food Insecurity:    Worried About Programme researcher, broadcasting/film/video in the Last Year: Not on file   [employer] of Food in the Last Year: Not on file  Transportation Needs:    Lack of Transportation (Medical): Not on file   Lack of Transportation (Non-Medical): Not on file  Physical Activity:    Days of Exercise per Week: Not on file   Minutes of  Exercise per Session: Not on file  Stress:    Feeling of Stress : Not on file  Social Connections:    Frequency of Communication with Friends and Family: Not on file   Frequency of Social Gatherings with Friends and Family: Not on file   Attends Religious Services: Not on file   Active Member of Clubs or Organizations: Not on file   Attends Banker Meetings: Not on file   Marital Status: Not on file  Intimate Partner Violence:    Fear of Current or Ex-Partner: Not on file   Emotionally Abused: Not on file   Physically Abused: Not on file   Sexually Abused: Not on file    Family History:   The patient's family history includes Alcohol abuse in his father; Arrhythmia in his father; Heart disease in his father.    ROS:  Please see the history of present illness.  All other ROS reviewed and negative.     Physical Exam/Data:   Vitals:   February 20, 2019 1450 02-20-19 1453 2019-02-20 1515  BP:  112/76 117/75  Pulse:  (!) 129 (!) 161  Resp:  (!) 23 (!) 30  Temp:  99.1 F (37.3 C)   TempSrc:  Oral   SpO2:  98% 93%  Weight:  97.5 kg    Height: 5\' 11"  (1.803 m)     No intake or output data in the 24 hours ending 2019-02-20 1546 Last 3 Weights 02-20-19 02/08/2019 07/22/2018  Weight (lbs) 215 lb 175 lb 220 lb 6.4 oz  Weight (kg) 97.523 kg 79.379 kg 99.973 kg     Body mass index is 29.99 kg/m.  General:  Well nourished, well developed, in no acute distress HEENT: normal Lymph: no adenopathy Neck: + JVD Endocrine:  No thryomegaly Vascular: No carotid bruits; FA pulses 2+ bilaterally without bruits  Cardiac: Irregularly irregular; no murmur  Lungs: Crackles in bilateral bases, worse on the left side Abd: soft, nontender, no hepatomegaly  Ext: 3+ pitting edema in bilateral lower extremity Musculoskeletal:  No deformities, BUE and BLE strength normal and equal Skin: warm and dry  Neuro:  CNs 2-12 intact, no focal abnormalities noted Psych:  Normal affect    EKG:  The ECG that was done in the ED on 02-20-19, was personally reviewed and demonstrates atrial fibrillation with RVR  Relevant CV Studies:  Echo 12/18/2015 LV EF: 30% -   35% Study Conclusions   - Left ventricle: The cavity size was normal. There was moderate   concentric hypertrophy. Systolic function was moderately to   severely reduced. The estimated ejection fraction was in the   range of 30% to 35%. Moderate diffuse hypokinesis with no   identifiable regional variations. - Aortic valve: There was mild regurgitation. - Mitral valve: There was mild regurgitation. - Left atrium: The atrium was severely dilated. - Right ventricle: Systolic function was mildly reduced. - Right atrium: The atrium was mildly dilated.   Impressions:   - Evaluation of the aortic valve is impaired by poor image quality   and rapid irregular rhythm. Howver, there is suggestion of   possible severe aortic stenosis with low gradient. After a longer   diastole, the peak aortic gradient at times exceeds 60 mm Hg.   Also consider a component of tachycardia related  cardiomyopathy.    Laboratory Data:  High Sensitivity Troponin:  No results for input(s): TROPONINIHS in the last 720 hours.    Chemistry Recent Labs  Lab February 20, 2019 1456  NA  133*  K 4.1  CL 97*  CO2 24  GLUCOSE 183*  BUN 30*  CREATININE 1.26*  CALCIUM 8.7*  GFRNONAA 55*  GFRAA >60  ANIONGAP 12    No results for input(s): PROT, ALBUMIN, AST, ALT, ALKPHOS, BILITOT in the last 168 hours. Hematology Recent Labs  Lab 02/19/2019 1456  WBC 9.9  RBC 4.48  HGB 15.1  HCT 46.0  MCV 102.7*  MCH 33.7  MCHC 32.8  RDW 13.7  PLT 176   BNPNo results for input(s): BNP, PROBNP in the last 168 hours.  DDimer No results for input(s): DDIMER in the last 168 hours.   Radiology/Studies:  DG Chest Port 1 View  Result Date: 02/26/2019 CLINICAL DATA:  Shortness of breath EXAM: PORTABLE CHEST 1 VIEW COMPARISON:  12/28/2013 FINDINGS: Cardiomegaly. Suspect left pleural effusion and associated atelectasis or consolidation. Benign calcified nodule of the left upper lobe. The visualized skeletal structures are unremarkable. IMPRESSION: 1.  Cardiomegaly. 2. Suspect left pleural effusion and associated atelectasis or consolidation. PA and lateral radiographs may be helpful to further evaluate. Electronically Signed   By: Lauralyn PrimesAlex  Bibbey M.D.   On: 02/16/2019 15:30   {  Assessment and Plan:   Chronic atrial fibrillation with RVR: Even though patient says he has been compliant with metoprolol, however he has history of noncompliance.  Very questionable compliance with metoprolol at home.  Heart rate in the 140s on arrival.  Continue IV diltiazem for now, likely will transition to Toprol-XL as diltiazem is less ideal in patients with LV dysfunction.  He is not a good candidate for anticoagulation therapy due to noncompliance, he is only taking aspirin at home.  Acute on chronic systolic heart failure: Likely related to dietary indiscretion with increased salt intake and large amount of fluid intake.  EF 30  to 35% on previous echocardiogram in 2017.  Will repeat echocardiogram.  He is massively volume overloaded with at least 3+ pitting edema in bilateral lower extremity.  He has crackles up to one half of the left lung and one third of the right lung.  Will start on 80 mg twice daily of IV Lasix.  He was agreeable to Foley catheter given the need for aggressive diuresis.   Aortic stenosis: Previous echocardiogram in 2015 and 2017 suggestive of moderate to severe aortic stenosis.  Will repeat echocardiogram.  Hypertension: Blood pressure stable  History of noncompliance: He has refused cardiac catheterization in the past and left hospital AMA.   Severity of Illness: The appropriate patient status for this patient is INPATIENT. Inpatient status is judged to be reasonable and necessary in order to provide the required intensity of service to ensure the patient's safety. The patient's presenting symptoms, physical exam findings, and initial radiographic and laboratory data in the context of their chronic comorbidities is felt to place them at high risk for further clinical deterioration. Furthermore, it is not anticipated that the patient will be medically stable for discharge from the hospital within 2 midnights of admission. The following factors support the patient status of inpatient.   " The patient's presenting symptoms include dyspnea. " The worrisome physical exam findings include crackle in the lung, tachycardic. " The initial radiographic and laboratory data are worrisome because of mildly elevated Cr. " The chronic co-morbidities include chronic systolic CHF and afib.   * I certify that at the point of admission it is my clinical judgment that the patient will require inpatient hospital care spanning beyond 2 midnights from the point of  admission due to high intensity of service, high risk for further deterioration and high frequency of surveillance required.*    For questions or updates,  please contact [facility] Please consult [email or web address] for contact info under        Ramond Dial, [place]  03/04/19 3:46 PM

## 2019-02-15 NOTE — ED Provider Notes (Signed)
[place] EMERGENCY DEPARTMENT Provider Note   CSN: [id number] Arrival date & time: 02/26/19  1443     History Chief Complaint  Patient presents with  . Atrial Fibrillation    Samuel [name] is a 74 y.o. male with past medical history of atrial fibrillation, CHF, aortic stenosis, presenting to the emergency department via EMS with complaint of shortness of breath.  He states it has been worsening over the last 6 weeks.  He had a telemedicine visit with his cardiologist, Dr. Acie Fredrickson, on 02/08/2019.  He recommended he increase his Lasix dose to 40 mg twice daily as well as his potassium.  He states he has had worsening shortness of breath and states his legs have doubled in size.  EMS was called out due to tachycardia today.  He is noted to be in A. fib with RVR.  Patient is unable to tell me history of A. fib however per chart review he has been seen in the past and admitted for CHF exacerbation with A. fib with RVR.  Last documentation reveals he has been on metoprolol for his rate control for A. fib, however per EMS it is reported he was started on Cardizem yesterday.  Per chart review there is no clear visit discussing this.  He states he takes aspirin though does not believe he is on any anticoagulation.  The history is provided by the patient and medical records.       Past Medical History:  Diagnosis Date  . Atrial fibrillation (HCC)    hx of atrial fibrilation  . CHF (congestive heart failure) [facility])     Patient Active Problem List   Diagnosis Date Noted  . Aortic stenosis   . Chronic systolic CHF (congestive heart failure) ([place])   . Atrial fibrillation ([place]) 12/28/2013  . Non compliance w medication regimen 12/28/2013  . Congestive heart failure ([place]) 04/27/2013  . Back pain 04/27/2013  . Venous stasis dermatitis of left lower extremity 04/19/2013    Past Surgical History:  Procedure Laterality Date  . FINGER SURGERY    . VASECTOMY         Family  History  Problem Relation Age of Onset  . Heart disease Father   . Arrhythmia Father   . Alcohol abuse Father     Social History   Tobacco Use  . Smoking status: Current Every Day Smoker    Types: Pipe  . Smokeless tobacco: Never Used  . Tobacco comment: pipe 3 times /day  Substance Use Topics  . Alcohol use: No  . Drug use: No    Home Medications Prior to Admission medications   Medication Sig Start Date End Date Taking? Authorizing Provider  aspirin EC 81 MG tablet Take 81 mg by mouth daily.    [provider]  furosemide (LASIX) 40 MG tablet Take 1 tablet (40 mg total) by mouth 2 (two) times daily. 02/08/19   Nahser, Wonda Cheng, MD  metoprolol tartrate (LOPRESSOR) 50 MG tablet Take 50 mg by mouth 2 (two) times daily.    [provider]  potassium chloride SA (K-DUR) 20 MEQ tablet Take 1 tablet (20 mEq total) by mouth 2 (two) times daily. 07/22/18   Loel Dubonnet, NP    Allergies    Patient has no known allergies.  Review of Systems   Review of Systems  Respiratory: Positive for shortness of breath.   Cardiovascular: Positive for leg swelling. Negative for chest pain.  All other systems reviewed  and are negative.   Physical Exam Updated Vital Signs BP 96/80   Pulse (!) 58   Temp 99.1 F (37.3 C) (Oral)   Resp (!) 27   Ht 5\' 11"  (1.803 m)   Wt 97.5 kg   SpO2 96%   BMI 29.99 kg/m   Physical Exam Vitals and nursing note reviewed.  Constitutional:      General: He is not in acute distress.    Appearance: He is well-developed.  HENT:     Head: Normocephalic and atraumatic.  Eyes:     Conjunctiva/sclera: Conjunctivae normal.  Cardiovascular:     Rate and Rhythm: Tachycardia present. Rhythm irregular.  Pulmonary:     Effort: Pulmonary effort is normal. No respiratory distress.     Comments: Fine crackles in the bases Abdominal:     General: Bowel sounds are normal.     Palpations: Abdomen is soft.     Tenderness: There is no abdominal  tenderness.  Musculoskeletal:     Comments: 3+ pitting edema BLE  Skin:    General: Skin is warm.  Neurological:     Mental Status: He is alert.  Psychiatric:        Behavior: Behavior normal.     ED Results / Procedures / Treatments   Labs (all labs ordered are listed, but only abnormal results are displayed) Labs Reviewed  BASIC METABOLIC PANEL - Abnormal; Notable for the following components:      Result Value   Sodium 133 (*)    Chloride 97 (*)    Glucose, Bld 183 (*)    BUN 30 (*)    Creatinine, Ser 1.26 (*)    Calcium 8.7 (*)    GFR calc non Af Amer 55 (*)    All other components within normal limits  CBC WITH DIFFERENTIAL/PLATELET - Abnormal; Notable for the following components:   MCV 102.7 (*)    Monocytes Absolute 1.4 (*)    All other components within normal limits  BRAIN NATRIURETIC PEPTIDE - Abnormal; Notable for the following components:   B Natriuretic Peptide 1,296.1 (*)    All other components within normal limits    EKG EKG Interpretation  Date/Time:  Tuesday February 15 2019 14:54:05 EST Ventricular Rate:  147 PR Interval:    QRS Duration: 100 QT Interval:  272 QTC Calculation: 426 R Axis:   31 Text Interpretation: Atrial fibrillation with rapid V-rate Low voltage, precordial leads Anteroseptal infarct, old Repolarization abnormality, prob rate related Abnormal ECG Confirmed by 07-03-1971 [phone]) on 01/29/2019 3:12:05 PM   Radiology DG Chest Port 1 View  Result Date: 02/05/2019 CLINICAL DATA:  Shortness of breath EXAM: PORTABLE CHEST 1 VIEW COMPARISON:  12/28/2013 FINDINGS: Cardiomegaly. Suspect left pleural effusion and associated atelectasis or consolidation. Benign calcified nodule of the left upper lobe. The visualized skeletal structures are unremarkable. IMPRESSION: 1.  Cardiomegaly. 2. Suspect left pleural effusion and associated atelectasis or consolidation. PA and lateral radiographs may be helpful to further evaluate. Electronically  Signed   By: 14/02/2013 M.D.   On: 02/24/2019 15:30    Procedures .Critical Care Performed by: , 02/17/2019 N, PA-C Authorized by: , [place] N, PA-C   Critical care provider statement:    Critical care time (minutes):  45   Critical care time was exclusive of:  Teaching time and separately billable procedures and treating other patients   Critical care was necessary to treat or prevent imminent or life-threatening deterioration of the following conditions:  Cardiac failure  and circulatory failure   Critical care was time spent personally by me on the following activities:  Discussions with consultants, evaluation of patient's response to treatment, examination of patient, ordering and performing treatments and interventions, ordering and review of laboratory studies, ordering and review of radiographic studies, pulse oximetry, re-evaluation of patient's condition, obtaining history from patient or surrogate and review of old charts   I assumed direction of critical care for this patient from another provider in my specialty: no     (including critical care time)  Medications Ordered in ED Medications  diltiazem (CARDIZEM) 125 mg in dextrose 5% 125 mL (1 mg/mL) infusion (10 mg/hr Intravenous Rate/Dose Change 03-07-19 1558)  furosemide (LASIX) injection 80 mg (has no administration in time range)  digoxin (LANOXIN) 0.25 MG/ML injection 0.25 mg (has no administration in time range)    ED Course  I have reviewed the triage vital signs and the nursing notes.  Pertinent labs & imaging results that were available during my care of the patient were reviewed by me and considered in my medical decision making (see chart for details).    MDM Rules/Calculators/A&P                      Patient with history of A. fib, CHF, and medication noncompliance, presenting with worsening shortness of breath as well as A. fib with RVR.  He recently had a telemedicine visit with his cardiologist,  Dr.Nahser, on 02/09/2019.  He was instructed to increase his dose of Lasix and potassium, however it did not improve his symptoms.  He reports worsening leg swelling as well.  He states he was started on a new medication yesterday, however is unsure of the name of the medication.  EMS reports Cardizem was started yesterday.  He is not on anticoagulation.  On arrival he is in A. fib with RVR.  3+ pitting edema bilateral lower extremities.  He is oxygenating well on room air though with fine crackles in the bases.  Cardizem infusion ordered for rate control.  Labs reveal normal white count and hemoglobin.  Creatinine is slightly up from baseline at 1.26.  BNP is also elevated at 1300.  Chest x-ray with suspected left pleural effusion.  Cardiology evaluating patient for disposition.  Cardiology to admit for CHF exacerbation and a fib with rvr. Appreciate consult.  Final Clinical Impression(s) / ED Diagnoses Final diagnoses:  Atrial fibrillation with RVR (HCC)  Acute on chronic congestive heart failure, unspecified heart failure type [facility])    Rx / DC Orders ED Discharge Orders    None       , [place] N, PA-C March 07, 2019 1629    Gerhard Munch, MD 02/08/2019 [phone]

## 2019-02-15 NOTE — Progress Notes (Signed)
Patient here with afib RVR

## 2019-02-15 NOTE — Progress Notes (Addendum)
SBP dropped below 90.  Cardizem drip stopped, MD paged.   0043-SBP remaining in the 80s, with HR in the upper 90s to 120s, pt asymptomatic, cardizem drip remains paused. MD notified.

## 2019-02-16 ENCOUNTER — Inpatient Hospital Stay (HOSPITAL_COMMUNITY): Payer: Medicare Other

## 2019-02-16 DIAGNOSIS — I350 Nonrheumatic aortic (valve) stenosis: Secondary | ICD-10-CM

## 2019-02-16 DIAGNOSIS — I4891 Unspecified atrial fibrillation: Secondary | ICD-10-CM

## 2019-02-16 LAB — BASIC METABOLIC PANEL
Anion gap: 11 (ref 5–15)
BUN: 28 mg/dL — ABNORMAL HIGH (ref 8–23)
CO2: 28 mmol/L (ref 22–32)
Calcium: 8.3 mg/dL — ABNORMAL LOW (ref 8.9–10.3)
Chloride: 94 mmol/L — ABNORMAL LOW (ref 98–111)
Creatinine, Ser: 1.2 mg/dL (ref 0.61–1.24)
GFR calc Af Amer: >60 mL/min (ref >60)
GFR calc non Af Amer: 59 mL/min — ABNORMAL LOW (ref >60)
Glucose, Bld: 121 mg/dL — ABNORMAL HIGH (ref 70–99)
Potassium: 3.9 mmol/L (ref 3.5–5.1)
Sodium: 133 mmol/L — ABNORMAL LOW (ref 135–145)

## 2019-02-16 LAB — MAGNESIUM: Magnesium: 2.1 mg/dL (ref 1.7–2.4)

## 2019-02-16 LAB — HEPARIN LEVEL (UNFRACTIONATED): Heparin Unfractionated: 0.63 IU/mL (ref 0.30–0.70)

## 2019-02-16 MED ORDER — AMIODARONE HCL IN DEXTROSE 360-4.14 MG/200ML-% IV SOLN
60.0000 mg/h | INTRAVENOUS | Status: DC
  Administered 2019-02-16 (×2): 30 mg/h via INTRAVENOUS
  Administered 2019-02-17 (×2): 60 mg/h via INTRAVENOUS
  Administered 2019-02-17: 30 mg/h via INTRAVENOUS
  Filled 2019-02-16 (×6): qty 200

## 2019-02-16 MED ORDER — AMIODARONE LOAD VIA INFUSION
150.0000 mg | Freq: Once | INTRAVENOUS | Status: AC
  Administered 2019-02-16: 150 mg via INTRAVENOUS
  Filled 2019-02-16: qty 83.34

## 2019-02-16 MED ORDER — AMIODARONE HCL IN DEXTROSE 360-4.14 MG/200ML-% IV SOLN
60.0000 mg/h | INTRAVENOUS | Status: AC
  Administered 2019-02-16: 11:00:00 60 mg/h via INTRAVENOUS
  Filled 2019-02-16: qty 200

## 2019-02-16 MED ORDER — POTASSIUM CHLORIDE CRYS ER 20 MEQ PO TBCR
20.0000 meq | EXTENDED_RELEASE_TABLET | Freq: Two times a day (BID) | ORAL | Status: DC
  Administered 2019-02-16: 20 meq via ORAL
  Filled 2019-02-16: qty 1

## 2019-02-16 MED ORDER — ORAL CARE MOUTH RINSE
15.0000 mL | Freq: Two times a day (BID) | OROMUCOSAL | Status: DC
  Administered 2019-02-16 – 2019-02-17 (×2): 15 mL via OROMUCOSAL

## 2019-02-16 MED ORDER — POTASSIUM CHLORIDE CRYS ER 20 MEQ PO TBCR
40.0000 meq | EXTENDED_RELEASE_TABLET | Freq: Two times a day (BID) | ORAL | Status: DC
  Administered 2019-02-16 – 2019-02-17 (×3): 40 meq via ORAL
  Filled 2019-02-16 (×3): qty 2

## 2019-02-16 NOTE — Progress Notes (Signed)
ANTICOAGULATION CONSULT NOTE  Pharmacy Consult for Heparin Indication: atrial fibrillation  No Known Allergies  Patient Measurements: Height: 5\' 11"  (180.3 cm) Weight: 227 lb 4.7 oz (103.1 kg) IBW/kg (Calculated) : 75.3 Heparin Dosing Weight: 95.1  Vital Signs: Temp: 97.4 F (36.3 C) (01/20 0408) Temp Source: Oral (01/20 0408) BP: 93/71 (01/20 0530) Pulse Rate: 109 (01/20 0530)  Labs: Recent Labs    01/31/2019 1456 02/20/2019 2224 02/16/19 0331  HGB 15.1  --   --   HCT 46.0  --   --   PLT 176  --   --   HEPARINUNFRC  --  0.58 0.63  CREATININE 1.26*  --  1.20    Estimated Creatinine Clearance: 65 mL/min (by C-G formula based on SCr of 1.2 mg/dL).   Medical History: Past Medical History:  Diagnosis Date  . Atrial fibrillation (HCC)    hx of atrial fibrilation  . CHF (congestive heart failure) [facility])     Assessment: 74 y.o. male presenting with past medical history of hypertension, chronic systolic CHF, chronic atrial fibrillation, noncompliance and aortic stenosis who presented with 6 weeks onset of increasing dyspnea on exertion and weakness. Patient was not on prior anticoagulation to this admission.   Heparin level last night came back therapeutic at 0.58, on 1400 units/hr. On same rate, heparin level this morning again came back therapeutic at 0.63, on 1400 units/hr. No s/sx of bleeding or infusion issues.   Goal of Therapy:  Heparin level 0.3-0.7 units/ml Monitor platelets by anticoagulation protocol: Yes   Plan: Continue heparin infusion at 1400 units/hr Check heparin level daily while on heparin Monitor CBC and s/sx of bleeding  F/u transition to PO AC  59, PharmD, BCCCP Clinical Pharmacist  Phone: [phone]  Please check AMION for all [facility] Pharmacy phone numbers After 10:00 PM, call Main Pharmacy [phone] 02/16/2019,7:19 AM

## 2019-02-16 NOTE — Progress Notes (Signed)
   02/16/19 1935  MEWS Assessment  Is this an acute change? No   Pt admitted for afib rvr and mews has been elevated due to hr, sbp, and rr

## 2019-02-16 NOTE — Progress Notes (Signed)
Patient had 40 beat run of vtach-pt appeared to be asymptomatic-MD notified  Patient SBP dropped below 90 after cardizem drip initiated at 2.5/hr.  Cardizem dripped stopped, MD notified.

## 2019-02-16 NOTE — Progress Notes (Addendum)
Pt's blood pressure fluctuating with systolic in the 80's-120's. Cardizem on    Stand-by since 0500 d/t low BP, cardiology paged by night shift without a return call. Current BP 113/68 with HR up to 170s - PO metoprolol given (per Dr. Katrinka Blazing note give despite hypotension). Cardizem restarted at 2.5mg , Heart rhythm a-fib rvr with rate getting as high as 180 (non-sustained), sustaining between 140-160s. Pt short of breath during slight positional changes - ECHO unable to be performed d/t pt not tolerating lying on left side - HR up to 180 at that time.  O2 sats 95-100% with oxygen on and off (pt won't keep on but asks for it d/t SOB). Will monitor patient closely and page Dr. Katrinka Blazing to round on patient as soon as available.

## 2019-02-16 NOTE — Progress Notes (Addendum)
Rapid Response Progress Note  While rounding on unit, charge RN asked that RRRN round on Mr. Digirolamo. Primary RN at bedside and explains that Cardizem gtt was turned off at 0500 d/t pt sustaining soft BP. Pt HR now sustaining 140-160 bpm with intermittent escalation to 170-180 bpm. Rhythm on monitor appears to be afib RVR. Primary RN notified Dr. Katrinka Blazing and eventually restarted Cardizem gtt at 2.5 mg/hr. Manual BP checked 102/55, BP difficult to auscultate d/t irregular rhythm. Cardizem gtt increased to 5mg /hr, HR sustaining around 130 bpm. Pt alert and oriented. Pt has accessory muscle use, initially oxygen saturation 90% on RA. 2LNC placed on pt. Pt is able to talk in complete sentences without stopping to take breath. Lung sounds are clear, diminished. Oxygen saturation increased to 94% on 2LNC. RN endorses pt becomes very short of breath with minimal position changes. 2+ edema in bilateral LEs.   RN sitting at bedside with pt, pt on continuous telemetry monitoring, RN rechecking BP q15 minutes until cardiologist arrives at bedside.   RRRN called away. RN to call rapid response for further needs.

## 2019-02-16 NOTE — Progress Notes (Signed)
  Echocardiogram 2D Echocardiogram has been attempted. Patient HR 180 after turning to left side. Will reattempt echo at later time. HR too high.  [name] 02/16/2019, 9:11 AM

## 2019-02-16 NOTE — Progress Notes (Addendum)
The patient has been seen in conjunction with Reino Bellis, NP. All aspects of care have been considered and discussed. The patient has been personally interviewed, examined, and all clinical data has been reviewed.   Currently sitting having lunch.  The mere movement of going from supine to sitting led to significant dyspnea.  Significant reduction in breath sounds left posterior thorax.  May need therapeutic thoracentesis.  Plan continue IV diuresis, closely watching kidney function and blood pressure.  Have switched to IV amiodarone for better rate control, especially in setting of nonsustained VT earlier this morning.  Awaiting echo report and suspect LV systolic function will be terrible.  May need to make some decisions concerning rhythm (especially if LV function is dramatically worse raising the question of tachycardia related decrease in LV function), VT, ICD...   Progress Note  Patient Name: Samuel [name] Date of Encounter: 02/16/2019  Primary Cardiologist: Mertie Moores, MD   Subjective   Still short of breath this morning. Rates remain elevated.   Inpatient Medications    Scheduled Meds: . amiodarone  150 mg Intravenous Once  . aspirin EC  81 mg Oral Daily  . furosemide  80 mg Intravenous BID  . mouth rinse  15 mL Mouth Rinse BID  . metoprolol tartrate  25 mg Oral BID  . potassium chloride SA  20 mEq Oral BID  . sodium chloride flush  3 mL Intravenous Q12H   Continuous Infusions: . sodium chloride    . amiodarone     Followed by  . amiodarone    . heparin 1,400 Units/hr (02/16/19 0757)   PRN Meds: sodium chloride, acetaminophen, ondansetron (ZOFRAN) IV, sodium chloride flush, zolpidem   Vital Signs    Vitals:   02/16/19 1000 02/16/19 1015 02/16/19 1023 02/16/19 1031  BP: 90/78 (!) 102/55 103/77 (!) 90/57  Pulse: (!) 37  80 (!) 59  Resp: (!) 41  (!) 29 (!) 28  Temp:      TempSrc:      SpO2: 95%  97% 93%  Weight:      Height:         Intake/Output Summary (Last 24 hours) at 02/16/2019 1039 Last data filed at 02/16/2019 0900 Gross per 24 hour  Intake 942.74 ml  Output 2125 ml  Net -1182.26 ml   Last 3 Weights 02/16/2019 02/16/2019 03-12-19  Weight (lbs) 215 lb 2.7 oz 227 lb 4.7 oz 219 lb 2.2 oz  Weight (kg) 97.6 kg 103.1 kg 99.4 kg      Telemetry    Afib RVR, long 40 beat run of VT this morning - Personally Reviewed  ECG    No new tracing  Physical Exam  [name], sitting up in bed.  GEN: No acute distress.   Neck: + JVD Cardiac: Irreg Irreg tachy, soft systolic murmur, no rubs, or gallops.  Respiratory: Diminished bilaterally, crackles in bases.  GI: Soft, nontender, non-distended  MS: 2+ pitting edema to knees bilaterally; No deformity. Neuro:  Nonfocal  Psych: Normal affect   Labs    High Sensitivity Troponin:  No results for input(s): TROPONINIHS in the last 720 hours.    Chemistry Recent Labs  Lab 12-Mar-2019 1456 02/16/19 0331  NA 133* 133*  K 4.1 3.9  CL 97* 94*  CO2 24 28  GLUCOSE 183* 121*  BUN 30* 28*  CREATININE 1.26* 1.20  CALCIUM 8.7* 8.3*  GFRNONAA 55* 59*  GFRAA >60 >60  ANIONGAP 12 11     Hematology  Recent Labs  Lab 03-02-19 1456  WBC 9.9  RBC 4.48  HGB 15.1  HCT 46.0  MCV 102.7*  MCH 33.7  MCHC 32.8  RDW 13.7  PLT 176    BNP Recent Labs  Lab 2019/03/02 1457  BNP 1,296.1*     DDimer No results for input(s): DDIMER in the last 168 hours.   Radiology    DG Chest Port 1 View  Result Date: 02-Mar-2019 CLINICAL DATA:  Shortness of breath EXAM: PORTABLE CHEST 1 VIEW COMPARISON:  12/28/2013 FINDINGS: Cardiomegaly. Suspect left pleural effusion and associated atelectasis or consolidation. Benign calcified nodule of the left upper lobe. The visualized skeletal structures are unremarkable. IMPRESSION: 1.  Cardiomegaly. 2. Suspect left pleural effusion and associated atelectasis or consolidation. PA and lateral radiographs may be helpful to further evaluate.  Electronically Signed   By: Lauralyn Primes M.D.   On: 03/02/19 15:30    Cardiac Studies   TTE: pending  Patient Profile     74 y.o. male with past medical history of hypertension, chronic systolic CHF, chronic atrial fibrillation, noncompliance and aortic stenosis who presented with 6 weeks onset of increasing dyspnea on exertion and weakness.  Assessment & Plan    1. Chronic Afib RVR: rates remain elevated this morning. Having episodes of hypotension while on Dilt. Had a long run of VT this morning. Will switch to amiodarone this morning in attempts to better rate control and possible convert. Has not been a good candidate for OAC in the past 2/2 to noncompliance.  -- on IV heparin -- received dose of metoprolol this morning as well  2. Acute on Chronic systolic HF: known EF of 30-35% back in 2017. BNP 1296. Left sided pleural effusion. Still significantly volume overloaded this morning with soft blood pressures. Does not appear low output at this time.  -- UOP 1.5L overnight. Will continue with IV lasix, consider a dose of metolazone later today if blood pressures remain stable with switch to amiodarone.   3. HTN: blood pressures are soft this morning. Plan as above  4. Noncompliance: hx of same, past admissions leaving AMA.   For questions or updates, please contact [facility] Please consult [email or web address] for contact info under        Signed, Laverda Page, NP  02/16/2019, 10:39 AM

## 2019-02-16 NOTE — Progress Notes (Signed)
Paged MD regarding scheduled lopressor.  BP 100/75.  MD stated to give.

## 2019-02-16 NOTE — Progress Notes (Addendum)
Notified MD of HR treadning up since cardizem drip stopped. Current BP 110/69.  MD called and gave verbal order to start cardizem drip in half at 2.5.    Drip order modified to verbal order.

## 2019-02-16 NOTE — Procedures (Signed)
Heart rate too high for accurate valve evaluation by echo at this time.

## 2019-02-17 ENCOUNTER — Inpatient Hospital Stay (HOSPITAL_COMMUNITY): Payer: Medicare Other

## 2019-02-17 ENCOUNTER — Inpatient Hospital Stay: Payer: Self-pay

## 2019-02-17 DIAGNOSIS — J90 Pleural effusion, not elsewhere classified: Secondary | ICD-10-CM

## 2019-02-17 DIAGNOSIS — R0602 Shortness of breath: Secondary | ICD-10-CM

## 2019-02-17 LAB — MAGNESIUM: Magnesium: 2.2 mg/dL (ref 1.7–2.4)

## 2019-02-17 LAB — ECHOCARDIOGRAM COMPLETE
Height: 71 in
Weight: 3474.45 oz

## 2019-02-17 LAB — BASIC METABOLIC PANEL
Anion gap: 13 (ref 5–15)
BUN: 27 mg/dL — ABNORMAL HIGH (ref 8–23)
CO2: 25 mmol/L (ref 22–32)
Calcium: 8.6 mg/dL — ABNORMAL LOW (ref 8.9–10.3)
Chloride: 96 mmol/L — ABNORMAL LOW (ref 98–111)
Creatinine, Ser: 1.17 mg/dL (ref 0.61–1.24)
GFR calc Af Amer: >60 mL/min (ref >60)
GFR calc non Af Amer: >60 mL/min (ref >60)
Glucose, Bld: 157 mg/dL — ABNORMAL HIGH (ref 70–99)
Potassium: 4.3 mmol/L (ref 3.5–5.1)
Sodium: 134 mmol/L — ABNORMAL LOW (ref 135–145)

## 2019-02-17 LAB — COOXEMETRY PANEL
Carboxyhemoglobin: 1.6 % — ABNORMAL HIGH (ref 0.5–1.5)
Methemoglobin: 0.8 % (ref 0.0–1.5)
O2 Saturation: 49.2 %
Total hemoglobin: 15.3 g/dL (ref 12.0–16.0)

## 2019-02-17 LAB — TSH: TSH: 8.173 u[IU]/mL — ABNORMAL HIGH (ref 0.350–4.500)

## 2019-02-17 LAB — HEPARIN LEVEL (UNFRACTIONATED): Heparin Unfractionated: 0.73 IU/mL — ABNORMAL HIGH (ref 0.30–0.70)

## 2019-02-17 MED ORDER — FUROSEMIDE 10 MG/ML IJ SOLN
10.0000 mg/h | INTRAVENOUS | Status: DC
  Administered 2019-02-17: 10 mg/h via INTRAVENOUS
  Filled 2019-02-17: qty 25

## 2019-02-17 MED ORDER — CHLORHEXIDINE GLUCONATE CLOTH 2 % EX PADS: 6.0000 | MEDICATED_PAD | Freq: Every day | CUTANEOUS | Status: DC

## 2019-02-17 MED ORDER — SODIUM CHLORIDE 0.9% FLUSH
10.0000 mL | Freq: Two times a day (BID) | INTRAVENOUS | Status: DC
  Administered 2019-02-17: 10 mL

## 2019-02-17 MED ORDER — PERFLUTREN LIPID MICROSPHERE
INTRAVENOUS | Status: AC
  Filled 2019-02-17: qty 10

## 2019-02-17 MED ORDER — AMIODARONE LOAD VIA INFUSION
150.0000 mg | Freq: Once | INTRAVENOUS | Status: AC
  Administered 2019-02-17: 150 mg via INTRAVENOUS
  Filled 2019-02-17: qty 83.34

## 2019-02-17 MED ORDER — SODIUM CHLORIDE 0.9% FLUSH: 10.0000 mL | INTRAVENOUS | Status: DC | PRN

## 2019-02-17 MED ORDER — PERFLUTREN LIPID MICROSPHERE
1.0000 mL | INTRAVENOUS | Status: AC | PRN
  Administered 2019-02-17: 1 mL via INTRAVENOUS
  Filled 2019-02-17: qty 10

## 2019-02-17 MED ORDER — DIGOXIN 125 MCG PO TABS
0.1250 mg | ORAL_TABLET | Freq: Every day | ORAL | Status: DC
  Administered 2019-02-17: 0.125 mg via ORAL
  Filled 2019-02-17: qty 1

## 2019-02-17 MED ORDER — FUROSEMIDE 10 MG/ML IJ SOLN
80.0000 mg | Freq: Once | INTRAMUSCULAR | Status: AC
  Administered 2019-02-17: 80 mg via INTRAVENOUS

## 2019-02-17 NOTE — Progress Notes (Signed)
ANTICOAGULATION CONSULT NOTE  Pharmacy Consult for Heparin Indication: atrial fibrillation  No Known Allergies  Patient Measurements: Height: 5\' 11"  (180.3 cm) Weight: 217 lb 2.5 oz (98.5 kg) IBW/kg (Calculated) : 75.3 Heparin Dosing Weight: 95.1  Vital Signs: Temp: 97.3 F (36.3 C) (01/21 0746) Temp Source: Oral (01/21 0746) BP: 118/94 (01/21 0746) Pulse Rate: 155 (01/21 0746)  Labs: Recent Labs    01/29/2019 1456 02/17/2019 2224 02/16/19 0331 02/17/19 0442  HGB 15.1  --   --   --   HCT 46.0  --   --   --   PLT 176  --   --   --   HEPARINUNFRC  --  0.58 0.63 0.73*  CREATININE 1.26*  --  1.20 1.17    Estimated Creatinine Clearance: 65.3 mL/min (by C-G formula based on SCr of 1.17 mg/dL).   Medical History: Past Medical History:  Diagnosis Date  . Atrial fibrillation (HCC)    hx of atrial fibrilation  . CHF (congestive heart failure) [facility])     Assessment: 74 y.o. male presenting with past medical history of hypertension, chronic systolic CHF, chronic atrial fibrillation, noncompliance and aortic stenosis who presented with 6 weeks onset of increasing dyspnea on exertion and weakness. Patient was not on prior anticoagulation to this admission.   Heparin level 0.7 this am on heparin drip 1400 uts/hr. No s/sx of bleeding or infusion issues.    Goal of Therapy:  Heparin level 0.3-0.7 units/ml Monitor platelets by anticoagulation protocol: Yes   Plan: Decrease heparin infusion at 1300 units/hr Check heparin level daily while on heparin Monitor CBC and s/sx of bleeding  F/u transition to PO AC  59 Pharm.D. CPP, BCPS Clinical Pharmacist [phone] 02/17/2019 10:32 AM     Please check AMION for all [facility] Pharmacy phone numbers After 10:00 PM, call Main Pharmacy [phone] 02/17/2019,10:32 AM

## 2019-02-17 NOTE — Progress Notes (Signed)
Peripherally Inserted Central Catheter/Midline Placement  The IV Nurse has discussed with the patient and/or persons authorized to consent for the patient, the purpose of this procedure and the potential benefits and risks involved with this procedure.  The benefits include less needle sticks, lab draws from the catheter, and the patient may be discharged home with the catheter. Risks include, but not limited to, infection, bleeding, blood clot (thrombus formation), and puncture of an artery; nerve damage and irregular heartbeat and possibility to perform a PICC exchange if needed/ordered by physician.  Alternatives to this procedure were also discussed.  Bard Power PICC patient education guide, fact sheet on infection prevention and patient information card has been provided to patient /or left at bedside.    PICC/Midline Placement Documentation  PICC Double Lumen 02/17/19 PICC Right Basilic 42 cm 0 cm (Active)  Indication for Insertion or Continuance of Line Vasoactive infusions 02/17/19 1900  Exposed Catheter (cm) 0 cm 02/17/19 1900  Site Assessment Clean;Dry;Intact 02/17/19 1900  Lumen #1 Status Flushed;Saline locked;Blood return noted 02/17/19 1900  Lumen #2 Status Flushed;Saline locked;Blood return noted 02/17/19 1900  Dressing Type Transparent;Securing device 02/17/19 1900  Dressing Status Clean;Dry;Intact;Antimicrobial disc in place 02/17/19 1900  Line Care Connections checked and tightened 02/17/19 1900  Dressing Intervention New dressing;Other (Comment) 02/17/19 1900  Dressing Change Due 02/24/19 02/17/19 1900   Patient gave written consent for PICC placement, refused to participate in PICC study    Tonna Boehringer 02/17/2019, 7:04 PM

## 2019-02-17 NOTE — Progress Notes (Addendum)
The patient has been seen in conjunction with Reino Bellis, NP. All aspects of care have been considered and discussed. The patient has been personally interviewed, examined, and all clinical data has been reviewed.   Refractory heart failure with continued volume overload and large left pleural effusions on exam.  Will have thoracenteses performed.  LV function appears worse on echo but with rapid rate it is difficult to determine.  My interpretation is that the clear space on echo represents pleural effusion but will await full interpretation by reader to exclude pericardial effusion since RV appears small.  Poor heart rate control despite beta-blocker and IV amiodarone, if pericardial effusion, increased heart rate would end up being more easily understood.  I have briefly reviewed the echo and believe it shows large pleural effusions but cannot be absolutely certain that pericardial effusion is not present..  EF is less than 35% but difficult to tell with high rate.  Discussed overall clinical situation with the patient.  Prognosis is guarded.  We will get opinion from advanced heart failure team.   Progress Note  Patient Name: Samuel [name] Date of Encounter: 02/17/2019  Primary Cardiologist: Mertie Moores, MD   Subjective   Feels much worse today. Breathing has not improved. Still with significant edema.   Inpatient Medications    Scheduled Meds: . aspirin EC  81 mg Oral Daily  . furosemide  80 mg Intravenous BID  . mouth rinse  15 mL Mouth Rinse BID  . potassium chloride SA  40 mEq Oral BID  . sodium chloride flush  3 mL Intravenous Q12H   Continuous Infusions: . sodium chloride    . amiodarone 30 mg/hr (02/16/19 2335)  . heparin 1,400 Units/hr (02/17/19 0857)   PRN Meds: sodium chloride, acetaminophen, ondansetron (ZOFRAN) IV, sodium chloride flush, zolpidem   Vital Signs    Vitals:   02/17/19 0410 02/17/19 0417 02/17/19 0530 02/17/19 0746  BP:  (!) 117/95   (!) 118/94  Pulse:   (!) 57 (!) 155  Resp:  (!) 28 (!) 22 18  Temp:  (!) 97.5 F (36.4 C)  (!) 97.3 F (36.3 C)  TempSrc:  Oral  Oral  SpO2:  98% 96% 96%  Weight: 98.5 kg     Height:        Intake/Output Summary (Last 24 hours) at 02/17/2019 1030 Last data filed at 02/17/2019 0400 Gross per 24 hour  Intake 1201.41 ml  Output 1300 ml  Net -98.59 ml   Last 3 Weights 02/17/2019 02/16/2019 02/16/2019  Weight (lbs) 217 lb 2.5 oz 215 lb 2.7 oz 227 lb 4.7 oz  Weight (kg) 98.5 kg 97.6 kg 103.1 kg      Telemetry    Afib RVR - Personally Reviewed  ECG    No new tracing this morning.   Physical Exam  Ill appearing [name], labored breathing, pale.  GEN: No acute distress.   Neck: + JVD Cardiac: Irreg Irreg, tachy, no murmurs, rubs, or gallops.  Respiratory: Diminished bilaterally, crackles throughout GI: Soft, nontender, non-distended  MS: Significant LE 3+ pitting edema, LE extremities are cool to touch Neuro:  Nonfocal  Psych: Normal affect   Labs    High Sensitivity Troponin:  No results for input(s): TROPONINIHS in the last 720 hours.    Chemistry Recent Labs  Lab 02/20/2019 1456 02/16/19 0331 02/17/19 0442  NA 133* 133* 134*  K 4.1 3.9 4.3  CL 97* 94* 96*  CO2 24 28 25   GLUCOSE  183* 121* 157*  BUN 30* 28* 27*  CREATININE 1.26* 1.20 1.17  CALCIUM 8.7* 8.3* 8.6*  GFRNONAA 55* 59* >60  GFRAA >60 >60 >60  ANIONGAP 12 11 13      Hematology Recent Labs  Lab 02/16/19 1456  WBC 9.9  RBC 4.48  HGB 15.1  HCT 46.0  MCV 102.7*  MCH 33.7  MCHC 32.8  RDW 13.7  PLT 176    BNP Recent Labs  Lab 02-16-19 1457  BNP 1,296.1*     DDimer No results for input(s): DDIMER in the last 168 hours.   Radiology    DG Chest Port 1 View  Result Date: 16-Feb-2019 CLINICAL DATA:  Shortness of breath EXAM: PORTABLE CHEST 1 VIEW COMPARISON:  12/28/2013 FINDINGS: Cardiomegaly. Suspect left pleural effusion and associated atelectasis or consolidation. Benign calcified nodule  of the left upper lobe. The visualized skeletal structures are unremarkable. IMPRESSION: 1.  Cardiomegaly. 2. Suspect left pleural effusion and associated atelectasis or consolidation. PA and lateral radiographs may be helpful to further evaluate. Electronically Signed   By: 14/02/2013 M.D.   On: 16-Feb-2019 15:30    Cardiac Studies   TTE: pending  Patient Profile     74 y.o. male with past medical history of hypertension, chronic systolic CHF, chronic atrial fibrillation, noncompliance and aortic stenosis who presented with 6 weeks onset of increasing dyspnea on exertion and weakness.  Assessment & Plan    1. Acute on Chronic systolic HF: has a known EF of 30-35%, BNP 1296. Left sided pleural effusion. Attempting to diuresis with IV lasix, did have 1.9L UOP but remains volume overloaded. Suspect he may be low output. Cr currently stable.  -- will call for stat echo  -- suspect he will need PICC and inotrope support, AHF consult? Will discuss with MD  2. Chronic Afib RVR: rates remain elevated this morning despite being on IV amiodarone. Received dose of metoprolol this morning. Will hold evening dose for now given concern for worsening CHF. Has not been a good candidate for OAC in the past 2/2 to noncompliance.  -- on IV heparin  3. HTN: blood pressures are stable currently.   4. Left sided pleural effusion: suspect he may need a thoracentesis as breath sounds are worse. -- will repeat CXR    For questions or updates, please contact [facility] Please consult [email or web address] for contact info under        Signed, 59, NP  02/17/2019, 10:30 AM

## 2019-02-17 NOTE — Consult Note (Addendum)
Advanced Heart Failure Team Consult Note   Primary Physician: Samuel [name], No Pcp Per PCP-Cardiologist:  Kristeen Miss, MD  Reason for Consultation: Heart Failure   HPI:    Samuel [name] is seen today for evaluation of heart failure at the request of Dr Katrinka Blazing.  Samuel [name] is a 74 year old with a history of A fib, not on anticoagulation because he refused,  chronic systolic heart failure, aortic stenosis, hypertension, and noncompliance. He has not had a cath.   He was initially diagnosed with atrial fibrillation with RVR when he presented with heart failure symptoms in December 2015.  He was placed on heparin drip and IV Lasix along with rate control therapy.  Echocardiogram obtained on 12/29/2013 showed EF 30 to 35%, akinesis of the basal mid inferior myocardium, moderate aortic stenosis, mild to moderate MR, PA peak pressure 44 mmHg.  Unfortunately before his work-up was complete, Samuel [name] left AMA.  During the hospitalization, cardiac catheterization was recommended to rule out CAD which he refused.  He was readmitted in 2017 with atrial fibrillation with RVR and volume overload.  Echocardiogram obtained on 12/18/2015 continue to show EF of 30 to 35%, mild AI, mild Samuel, severe LAE and diffuse hypokinesis with no identifiable regional variation.   Sounds like has had a progressive decline over the last few months.  SOB walking in the house and has to walk from chair to chair. Admitted with marked volume overload and A fib RVR. CXR with left pleural effusion. Initially placed on Cardizem drip but but later switched to amiodarone drip. Started on IV lasix 80 mg twice a day but has had poor response. Pertinent admission labs included: BNP 1296, hgb 15, creatinine 0.98, Sars Coronavirus negative.   SOB at rest. SOB with minimal exertion. +orthopnea.    Echo1/21/21 EF 20-25% Mod AS RV mildly reduced.   Review of Systems: [y] = yes, [ ]  = no   . General: Weight gain [Y ]; Weight loss [ ] ; Anorexia [  ]; Fatigue [Y ]; Fever [ ] ; Chills [ ] ; Weakness [Y ]  . Cardiac: Chest pain/pressure [ ] ; Resting SOB [Y ]; Exertional SOB [Y ]; Orthopnea [ Y]; Pedal Edema [ Y]; Palpitations [ ] ; Syncope [ ] ; Presyncope [ ] ; Paroxysmal nocturnal dyspnea[ ]   . Pulmonary: Cough [ ] ; Wheezing[ ] ; Hemoptysis[ ] ; Sputum [ ] ; Snoring [ ]   . GI: Vomiting[ ] ; Dysphagia[ ] ; Melena[ ] ; Hematochezia [ ] ; Heartburn[ ] ; Abdominal pain [ ] ; Constipation [ ] ; Diarrhea [ ] ; BRBPR [ ]   . GU: Hematuria[ ] ; Dysuria [ ] ; Nocturia[ ]   . Vascular: Pain in legs with walking [ ] ; Pain in feet with lying flat [ ] ; Non-healing sores [ ] ; Stroke [ ] ; TIA [ ] ; Slurred speech [ ] ;  . Neuro: Headaches[ ] ; Vertigo[ ] ; Seizures[ ] ; Paresthesias[ ] ;Blurred vision [ ] ; Diplopia [ ] ; Vision changes [ ]   . Ortho/Skin: Arthritis [ ] ; Joint pain [Y ]; Muscle pain [ ] ; Joint swelling [ ] ; Back Pain [Y ]; Rash [ ]   . Psych: Depression[Y ]; Anxiety[ ]   . Heme: Bleeding problems [ ] ; Clotting disorders [ ] ; Anemia [ ]   . Endocrine: Diabetes [ ] ; Thyroid dysfunction[ ]   Home Medications Prior to Admission medications   Medication Sig Start Date End Date Taking? Authorizing Provider  aspirin EC 81 MG tablet Take 81 mg by mouth daily.   Yes [provider]  furosemide (LASIX) 40 MG tablet Take 1 tablet (  40 mg total) by mouth 2 (two) times daily. Samuel [name] taking differently: Take 40 mg by mouth daily.  02/08/19  Yes Nahser, Deloris Ping, MD  metoprolol tartrate (LOPRESSOR) 50 MG tablet Take 50 mg by mouth 2 (two) times daily.   Yes [provider]  potassium chloride SA (K-DUR) 20 MEQ tablet Take 1 tablet (20 mEq total) by mouth 2 (two) times daily. 07/22/18  Yes Alver Sorrow, NP    Past Medical History: Past Medical History:  Diagnosis Date  . Atrial fibrillation (HCC)    hx of atrial fibrilation  . CHF (congestive heart failure) (HCC)     Past Surgical History: Past Surgical History:  Procedure Laterality Date  . FINGER  SURGERY    . VASECTOMY      Family History: Family History  Problem Relation Age of Onset  . Heart disease Father   . Arrhythmia Father   . Alcohol abuse Father     Social History: Social History   Socioeconomic History  . Marital status: Single    Spouse name: Not on file  . Number of children: 2  . Years of education: Not on file  . Highest education level: Not on file  Occupational History  . Occupation: retired  Tobacco Use  . Smoking status: Current Every Day Smoker    Types: Pipe  . Smokeless tobacco: Never Used  . Tobacco comment: pipe 3 times /day  Substance and Sexual Activity  . Alcohol use: No  . Drug use: No  . Sexual activity: Not on file  Other Topics Concern  . 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    Lost a daughter to an accident   He lives alone with his 2 dogs. His Son lives in [place]. He has 3 grandchildren.   Social Determinants of Health   Financial Resource Strain:   . Difficulty of Paying Living Expenses: Not on file  Food Insecurity:   . Worried About Programme researcher, broadcasting/film/video in the Last Year: Not on file  . Ran Out of Food in the Last Year: Not on file  Transportation Needs:   . Lack of Transportation (Medical): Not on file  . Lack of Transportation (Non-Medical): Not on file  Physical Activity:   . Days of Exercise per Week: Not on file  . Minutes of Exercise per Session: Not on file  Stress:   . Feeling of Stress : Not on file  Social Connections:   . Frequency of Communication with Friends and Family: Not on file  . Frequency of Social Gatherings with Friends and Family: Not on file  . Attends Religious Services: Not on file  . Active Member of Clubs or Organizations: Not on file  . Attends Banker Meetings: Not on file  . Marital Status: Not on file    Allergies:  No Known Allergies  Objective:    Vital Signs:   Temp:  [97.3 F (36.3 C)-97.7 F (36.5 C)] 97.3 F  (36.3 C) (01/21 0746) Pulse Rate:  [53-160] 155 (01/21 0746) Resp:  [15-31] 18 (01/21 0746) BP: (93-131)/(61-97) 118/94 (01/21 0746) SpO2:  [90 %-98 %] 96 % (01/21 0746) Weight:  [98.5 kg] 98.5 kg (01/21 0410) Last BM Date: (pta)  Weight change: Filed Weights   02/16/19 0525 02/16/19 0800 02/17/19 0410  Weight: 103.1 kg 97.6 kg 98.5 kg    Intake/Output:   Intake/Output Summary (Last 24 hours) at 02/17/2019 1320  Last data filed at 02/17/2019 1138 Gross per 24 hour  Intake 1201.41 ml  Output 1800 ml  Net -598.59 ml      Physical Exam    General:  Sitting on the side of the bed. Short of breath talking.  HEENT: normal Neck: supple. JVP to jaw  . Carotids 2+ bilat; no bruits. No lymphadenopathy or thyromegaly appreciated. Cor: PMI nondisplaced. Irregular rate & rhythm. No rubs, gallops or murmurs. Lungs: LLL decreased 1/2 way up. RLL decreased on 2liters oxygen.  Abdomen: soft, nontender, nondistended. No hepatosplenomegaly. No bruits or masses. Good bowel sounds. Extremities: no cyanosis, clubbing, rash, cool R and LLE 3+ edema Neuro: alert & orientedx3, cranial nerves grossly intact. moves all 4 extremities w/o difficulty. Affect pleasant   Telemetry   A Fib RVR 120-130s   EKG    A Fib RVR  147 bpm   Labs   Basic Metabolic Panel: Recent Labs  Lab 09-Mar-2019 1456 02/16/19 0331 02/16/19 0747 02/17/19 0442  NA 133* 133*  --  134*  K 4.1 3.9  --  4.3  CL 97* 94*  --  96*  CO2 24 28  --  25  GLUCOSE 183* 121*  --  157*  BUN 30* 28*  --  27*  CREATININE 1.26* 1.20  --  1.17  CALCIUM 8.7* 8.3*  --  8.6*  MG  --   --  2.1 2.2    Liver Function Tests: No results for input(s): AST, ALT, ALKPHOS, BILITOT, PROT, ALBUMIN in the last 168 hours. No results for input(s): LIPASE, AMYLASE in the last 168 hours. No results for input(s): AMMONIA in the last 168 hours.  CBC: Recent Labs  Lab March 09, 2019 1456  WBC 9.9  NEUTROABS 7.4  HGB 15.1  HCT 46.0  MCV 102.7*   PLT 176    Cardiac Enzymes: No results for input(s): CKTOTAL, CKMB, CKMBINDEX, TROPONINI in the last 168 hours.  BNP: BNP (last 3 results) Recent Labs    2019/03/09 1457  BNP 1,296.1*    ProBNP (last 3 results) Recent Labs    07/28/18 1320 08/06/18 1310  PROBNP 5,086* 7,872*     CBG: No results for input(s): GLUCAP in the last 168 hours.  Coagulation Studies: No results for input(s): LABPROT, INR in the last 72 hours.   Imaging    No results found.   Medications:     Current Medications: . aspirin EC  81 mg Oral Daily  . furosemide  80 mg Intravenous BID  . mouth rinse  15 mL Mouth Rinse BID  . perflutren lipid microspheres (DEFINITY) IV suspension      . potassium chloride SA  40 mEq Oral BID  . sodium chloride flush  3 mL Intravenous Q12H     Infusions: . sodium chloride    . amiodarone 30 mg/hr (02/17/19 1138)  . heparin 1,300 Units/hr (02/17/19 1036)       Assessment/Plan   1. A/C Systolic Heart Failure  -ECHO 2015 EF 30-35% - Refused cath in 2017 -ECHO 02/17/19 EF 25% with at least moderate AS  - Need RHC/LHC once diuresed.  - Today he appears cold and wet. Place PICC line for CVP and CO-OX. May need to add inotropes.  - Marked volume overload. Given 80 mg IV lasix and start lasix drip at 10 mg per hour.  - No bb for now.  - Add digoxin 0.125 mg daily.  - Hold off on arb/spiro for now. - Renal function stable.   -  Place unna boots.   2. A fib RVR Initially on diltiazem drip but with low EF switched to amio drip. Rate uncontrolled.  - Given 150 mg bolus now. Increase amio to 60 mg per hour.  - On heparin drip - In the past he refusef anticoagulation.   - Diurese then set up for TEE/Cardioverison.   3. Aortic Stenosis - On ECHO difficult to assess with A fib RVR. Will need TEE once diuresed to evaluate.   4. Left Pleural Effusion  Noted on CXR. Hopefully will improve with diuresis   5. Noncompliance. - He will need HF Paramedicine  at d/c  - I have referred to HF Paramedicine.    Medication concerns reviewed with Samuel [name] and pharmacy team. Barriers identified: Cost of meds. He will need eliquis at d/c but he is concerned about cost.   Length of Stay: 2  Tonye Becket, NP  02/17/2019, 1:20 PM  Advanced Heart Failure Team Pager [phone] (M-F; 7a - 4p)  Please contact [facility] for night-coverage after hours (4p -7a ) and weekends on [email or web address]  Samuel [name] seen and examined with the above-signed Advanced Practice Provider and/or Housestaff. I personally reviewed laboratory data, imaging studies and relevant notes. I independently examined the Samuel [name] and formulated the important aspects of the plan. I have edited the note to reflect any of my changes or salient points. I have personally discussed the plan with the Samuel [name] and/or family.  74 y/o male with h/o systolic HF of unknown etiology, persistent AF, AS and COPD. Admitted with recurrent HF. Now with class IV symptoms and massive volume overload in setting of AF with RVR. Has refused AC in past because he couldn't afford it. Had cath planned several years ago but signed out AMA. Markedly depressed over the death of his dog last year.   On exam General:  Chronically ill-appearing. No resp difficulty HEENT: normal Neck: supple. JVP to ear Carotids 2+ bilat; no bruits. No lymphadenopathy or thryomegaly appreciated. Cor: PMI nondisplaced. Irregular tachy. No rubs, gallops or murmurs. Lungs: dull on left 1/3 up  Abdomen: soft, nontender, + distended. No hepatosplenomegaly. No bruits or masses. Good bowel sounds. Extremities: no cyanosis, clubbing, rash, 3+ edema Neuro: alert & orientedx3, cranial nerves grossly intact. moves all 4 extremities w/o difficulty. Affect pleasant  He is markedly volume overloaded with low output HF in setting of AF with RVR. Will place PICC. Chech CVP and co-ox. Start lasix gtt. Continue IV amio and IV heparin. Will eventually need R/L cath and  possible attempt at [facility]. Place UNNA boots. Hoepfully he will not sign out AMA. Moderate left pleural effusion on CXR. May need thora if not responding to diuresis.   Arvilla Meres, MD  5:04 PM

## 2019-02-17 NOTE — Progress Notes (Signed)
IV site in left hand removed by patient by mistake.  Heparin placed on pause and so amiodarone can continue in the other site.

## 2019-02-17 NOTE — Progress Notes (Signed)
Pt  Very SOB, RR 40's HR 130's. PIV was accidentally pulled by pt.  Amio drip on hold,  Awaiting IV Team., PIV was placed.Spoke with IV RN, PICC will be placed in 1 to 1.5 hrs. Paged RR RN and Cards PA. Both came in the room to eval pt.  Amio and Lasix drip infusing at this time. Will monitor accordingly.

## 2019-02-17 NOTE — Progress Notes (Signed)
Orthopedic Tech Progress Note Patient Details:  Samuel [name] Nov 12, 1945 [id number]  Ortho Devices Type of Ortho Device: Ace wrap, Unna boot Ortho Device/Splint Location: Bilateral unna boot Ortho Device/Splint Interventions: Application   Post Interventions Patient Tolerated: Well Instructions Provided: Care of device   Saul Fordyce 02/17/2019, 2:38 PM

## 2019-02-17 NOTE — Significant Event (Signed)
Rapid Response Event Note  Overview: Cardiac - HR in the 130s and Respiratory - Tachypnea  Initial Focused Assessment: Nurse called with concerns of patient's RR being in the 40s, per nurse, patient was short of breath, somehow pulled his IV out, and his AMIO infusion was no longer running (d/t IV access loss). I asked her to page the MD and I was on my way. When I arrived, patient was sitting on the side of the bed, he had taken his oxygen off, his pulse ox was not on either. I placed his pulse ox back on - 98% on RA. RR was in the mid 20s, he was breathing comfortably now because he was sitting up. Lung sounds - diminished bilaterally, L > R, + 3 edema BLE, skin -- cool to touch. He was mildly confused and agitated when I first walked in but I could easily calm him. HR 120-130s AFIB. I was not able to obtain BP, he did not want to me check on but he is awake and talking. CARDS APP came to the bedside.  Interventions: -- No RRT Interventions  -- VAST RN placed another PIV so that AMIO can be restarted.   Plan of Care: -- PICC Line Placement - Continuous Lasix Drip - Per nurse should be placed in the 1-1.5 hours.  -- COOX/CVP -- Rest per AHF/CARDS teams  -- BIPAP might be needed - currently he is not working to breath and he is maintaining saturations.    Event Summary:  Call Time 1534 Arrival Time 1540 End Time 1605  [place] Nellums R

## 2019-02-17 NOTE — Progress Notes (Signed)
*  PRELIMINARY RESULTS* Echocardiogram 2D Echocardiogram with definity has been performed.  Jeryl Columbia 02/17/2019, 11:56 AM

## 2019-02-18 ENCOUNTER — Inpatient Hospital Stay (HOSPITAL_COMMUNITY): Payer: Medicare Other | Admitting: Registered Nurse

## 2019-02-18 LAB — CBC
HCT: 45.8 % (ref 39.0–52.0)
Hemoglobin: 15.1 g/dL (ref 13.0–17.0)
MCH: 34.3 pg — ABNORMAL HIGH (ref 26.0–34.0)
MCHC: 33 g/dL (ref 30.0–36.0)
MCV: 104.1 fL — ABNORMAL HIGH (ref 80.0–100.0)
Platelets: 247 10*3/uL (ref 150–400)
RBC: 4.4 MIL/uL (ref 4.22–5.81)
RDW: 14.1 % (ref 11.5–15.5)
WBC: 13.2 10*3/uL — ABNORMAL HIGH (ref 4.0–10.5)
nRBC: 4.3 % — ABNORMAL HIGH (ref 0.0–0.2)

## 2019-02-18 LAB — GLUCOSE, CAPILLARY: Glucose-Capillary: 131 mg/dL — ABNORMAL HIGH (ref 70–99)

## 2019-02-18 MED ORDER — MILRINONE LACTATE IN DEXTROSE 20-5 MG/100ML-% IV SOLN
0.1250 ug/kg/min | INTRAVENOUS | Status: DC
  Administered 2019-02-18: 0.125 ug/kg/min via INTRAVENOUS
  Filled 2019-02-18: qty 100

## 2019-02-18 MED FILL — Medication: Qty: 1 | Status: AC

## 2019-02-21 LAB — PATHOLOGIST SMEAR REVIEW

## 2019-02-28 NOTE — Progress Notes (Signed)
RN notified RR nurse, concerning pt Mews score of 5.  RN was advised to continue to monitor pt vital signs and O2 sats and notify RR nurse(Courtney) if anything changes.

## 2019-02-28 NOTE — Progress Notes (Signed)
RN has explained, the safety issues and  Fall  Risk.     Pt    Is  refusing  bed    Alarm.  Pt  complaining   of  being  Dizzy.

## 2019-02-28 NOTE — Progress Notes (Signed)
Cardiology paged to update on pt. Status.

## 2019-02-28 NOTE — Code Documentation (Addendum)
  Patient Name: Samuel [name]   MRN: [id number]   Date of Birth/ Sex: March 28, 1945 , male      Admission Date: 2019-03-14  Attending Provider: Lyn Records, MD  Primary Diagnosis: Acute on chronic systolic heart failure (HCC) [I50.23] Atrial fibrillation with RVR (HCC) [I48.91] Acute on chronic congestive heart failure, unspecified heart failure type (HCC) [I50.9]   Indication: Pt was in his usual state of health until this AM, when he was noted to be bradycardic with no palpable pulse. Code blue was subsequently called. At the time of arrival on scene, ACLS protocol was underway.   Technical Description:  - CPR performance duration:  12 minutes  5:44 - 5:56  - Was defibrillation or cardioversion used? Yes   - Was external pacer placed? Yes  - Was patient intubated pre/post CPR? Yes   Medications Administered: Y = Yes; Blank = No Amiodarone  x  Atropine  x  Calcium    Epinephrine  x  Lidocaine    Magnesium    Norepinephrine    Phenylephrine    Sodium bicarbonate  x  Vasopressin    Other    Post CPR evaluation:  - Final Status - Was patient successfully resuscitated ? No   Miscellaneous Information:  - Time of death:  5:56 AM  - Primary team notified?  Yes  - Family Notified? Yes (Wife, Son and Brother)      Katha Cabal, [place]   02/08/2019, 6:07 AM

## 2019-02-28 NOTE — Anesthesia Procedure Notes (Signed)
Procedure Name: Intubation Date/Time: 02-24-19 5:45 AM Performed by: Jearld Pies, CRNA Pre-anesthesia Checklist: Patient identified, Emergency Drugs available, Suction available and Patient being monitored Patient Re-evaluated:Patient Re-evaluated prior to induction Oxygen Delivery Method: Ambu bag Preoxygenation: Pre-oxygenation with 100% oxygen Laryngoscope Size: Mac and 3 Grade View: Grade I Tube type: Oral Tube size: 8.0 mm Number of attempts: 1 Airway Equipment and Method: Stylet Placement Confirmation: ETT inserted through vocal cords under direct vision,  breath sounds checked- equal and bilateral and CO2 detector Secured at: 24 cm Tube secured with: Tape Dental Injury: Teeth and Oropharynx as per pre-operative assessment

## 2019-02-28 NOTE — Progress Notes (Addendum)
Wife at bedside, stated that the body will be cremated and has not decided or contacted any [facility]  or who will do the Cremation yet . Wife took pts belongings, watch, cell phone , eyeglasses and clothes.

## 2019-02-28 NOTE — Progress Notes (Signed)
Chaplain responded to Code Blue.  Doctor called family to let them know of Samuel [name] transitioning.  Chaplain provided number to staff to call her if family needs support.

## 2019-02-28 NOTE — Progress Notes (Signed)
The chaplain was with the family as they visited their deceased loved one.  The chaplain offered support for the family that was present.  The chaplain will follow-up when additional family arrives.  Lavone Neri Chaplain Resident For questions concerning this note please contact me by pager [phone]

## 2019-02-28 NOTE — Significant Event (Signed)
Notified by RN for Code [facility] for PEA.  Arrived to find compressions ongoing. Per RN was found cyanotic and unresponsive. Electrical activity on the monitor with bradycardia but no pulse. ACLS in process. He received multiple round of epinephrine and bicarb (see code flow sheet for details) before rhythm degenerated into refractory VF not responsive to multiple defibrillations and amiodarone. Despite aforementioned resuscitation efforts patient expired. Time of death 6:56 AM.   Family notifed (son, brother) by code during resuscitation efforts and of the outcome.   Troyce Febo K. Charna Busman, MD

## 2019-02-28 NOTE — Progress Notes (Signed)
RN called with red MEWS, not an acute change. Instructed to continue to monitor VS and call RRT if assistance needed.

## 2019-02-28 NOTE — Progress Notes (Signed)
RN went into pt room and find pt to be non-responsive and gasp for a breath, then pt became cyanotic at this time.  RN checked for pulse and code Blue was initiated.  Family was contacted by DO.

## 2019-02-28 NOTE — Discharge Summary (Signed)
Death Summary    Patient ID: Samuel [name] MRN: [id number]; DOB: 1945/02/25  Admit date: 03/10/2019 Discharge date: Mar 13, 2019  Primary Care Provider: Patient, No Pcp Per  Primary Cardiologist: Kristeen Miss, MD  Primary Electrophysiologist:  None   Discharge Diagnoses    Principal Problem:   Acute on chronic systolic heart failure [facility]) Active Problems:   Atrial fibrillation with RVR (HCC)   Aortic stenosis    Diagnostic Studies/Procedures     2D Doppler echocardiogram 02/17/2019  PICC line 02/17/2019 _____________   History of Present Illness     Samuel [name] is a 74 y.o. male with hypertension, chronic systolic CHF, chronic atrial fibrillation, refusal of anticoagulation, noncompliance with diuretic regimen, and moderate aortic stenosis who presented with 6 weeks onset of increasing dyspnea on exertion and weakness.  On this occasion he had cut back on diuretic therapy because it was causing him to urinate too much.  Ultimately came to the emergency room because of shortness of breath.  He was admitted to the hospital for decompensated heart failure and poorly controlled atrial fibrillation rate response.  Also developed hypotension on IV diltiazem.    Hospital Course     Consultants: Advanced heart failure team, Dr. Arvilla Meres  During the first 36 hours of admission, the patient was refractory to therapy designed to slow atrial fibrillation rate response.  Diltiazem, beta-blocker, and amiodarone were sequentially used.  Attempts at IV diuresis were meager.  The patient remained volume overloaded, dyspnea, and with rapid heart rate.  Also noted to have left pleural effusion by both chest x-ray and clinical exam.  I rediscussed CODE STATUS with the patient and he confirmed that CPR would be appropriate if needed.  He was informed of nonresponse to therapy and agreed to have a consultation with the advanced heart failure team.  Overall poor prognosis was discussed with  the patient point 02/16/2018.  On 02/17/2019, the advanced heart failure team was consulted for help concerning refractory heart failure.  Please see consultation for recommendations.  An IV Lasix drip and milrinone were started.  IV amiodarone was continued and a dose of IV digoxin was administered.  The rapid response team was called to see the patient.  He remained unstable and uncomfortable through the night shift.  Mixed venous O2 saturation was noted to be 49% at 2325 after the PICC line was started.  The patient was found to be apneic with PEA at around 5:44 AM and CPR with ACLS was started.  Please see the note from Dr. Truman Hayward who noted refractory ventricular fibrillation unresponsive to defibrillatory therapy and amiodarone.  The patient was pronounced dead at 6:59 AM, Mar 12, 2018.  Cause of death, refractory acute on chronic systolic heart failure.  Did the patient have an acute coronary syndrome (MI, NSTEMI, STEMI, etc) this admission?:  No                               Did the patient have a percutaneous coronary intervention (stent / angioplasty)?:  No.   _____________  Discharge Vitals Blood pressure 112/72, pulse (!) 113, temperature 97.6 F (36.4 C), temperature source Oral, resp. rate (!) 24, height 5\' 11"  (1.803 m), weight 97.6 kg, SpO2 (!) 87 %.  Filed Weights   02/16/19 0800 02/17/19 0410 2019-03-13 0300  Weight: 97.6 kg 98.5 kg 97.6 kg    Labs & Radiologic Studies    CBC Recent Labs  02/21/2019 1456 2019/03/06 0500  WBC 9.9 13.2*  NEUTROABS 7.4  --   HGB 15.1 15.1  HCT 46.0 45.8  MCV 102.7* 104.1*  PLT 176 247   Basic Metabolic Panel Recent Labs    16/60/63 0331 02/16/19 0747 02/17/19 0442  NA 133*  --  134*  K 3.9  --  4.3  CL 94*  --  96*  CO2 28  --  25  GLUCOSE 121*  --  157*  BUN 28*  --  27*  CREATININE 1.20  --  1.17  CALCIUM 8.3*  --  8.6*  MG  --  2.1 2.2   Liver Function Tests No results for input(s): AST, ALT, ALKPHOS, BILITOT,  PROT, ALBUMIN in the last 72 hours. No results for input(s): LIPASE, AMYLASE in the last 72 hours. High Sensitivity Troponin:   No results for input(s): TROPONINIHS in the last 720 hours.  BNP Invalid input(s): POCBNP D-Dimer No results for input(s): DDIMER in the last 72 hours. Hemoglobin A1C No results for input(s): HGBA1C in the last 72 hours. Fasting Lipid Panel No results for input(s): CHOL, HDL, LDLCALC, TRIG, CHOLHDL, LDLDIRECT in the last 72 hours. Thyroid Function Tests Recent Labs    02/17/19 1426  TSH 8.173*   _____________  DG Chest 2 View  Result Date: 02/17/2019 CLINICAL DATA:  Shortness of breath, history of CHF and atrial fibrillation EXAM: CHEST - 2 VIEW COMPARISON:  01/31/2019 FINDINGS: Small left pleural effusion. Bibasilar atelectasis. Mild perihilar edema. Left upper lobe calcified granuloma is again noted. Stable cardiomediastinal contours. IMPRESSION: Mild perihilar edema, small left pleural effusion, and bibasilar atelectasis. Electronically Signed   By: Guadlupe Spanish M.D.   On: 02/17/2019 13:41   DG CHEST PORT 1 VIEW  Result Date: 02/17/2019 CLINICAL DATA:  PICC line placement EXAM: PORTABLE CHEST 1 VIEW COMPARISON:  02/17/2019 FINDINGS: There is a well-positioned right-sided PICC line with tip terminating near the cavoatrial junction. The heart size remains enlarged. There is a moderate left-sided pleural effusion. There is a small right-sided pleural effusion. Bibasilar atelectasis is again noted. There is no pneumothorax. There is a calcified granuloma in the left upper lobe. IMPRESSION: 1. Well-positioned right-sided PICC line. 2. Otherwise, no significant short interval change. Electronically Signed   By: Katherine Mantle M.D.   On: 02/17/2019 19:53   DG Chest Port 1 View  Result Date: 01/30/2019 CLINICAL DATA:  Shortness of breath EXAM: PORTABLE CHEST 1 VIEW COMPARISON:  12/28/2013 FINDINGS: Cardiomegaly. Suspect left pleural effusion and associated  atelectasis or consolidation. Benign calcified nodule of the left upper lobe. The visualized skeletal structures are unremarkable. IMPRESSION: 1.  Cardiomegaly. 2. Suspect left pleural effusion and associated atelectasis or consolidation. PA and lateral radiographs may be helpful to further evaluate. Electronically Signed   By: Lauralyn Primes M.D.   On: 02/19/2019 15:30   ECHOCARDIOGRAM COMPLETE  Result Date: 02/17/2019   ECHOCARDIOGRAM REPORT   Patient Name:   Samuel [name] Samuel [name] Date of Exam: 02/17/2019 Medical Rec #:  [id number]     Height:       71.0 in Accession #:    [id number]    Weight:       217.2 lb Date of Birth:  [birth year]     BSA:          2.18 m Patient Age:    73 years      BP:           118/94 mmHg Patient Gender: M  HR:           155 bpm. Exam Location:  Inpatient Procedure: 2D Echo and Intracardiac Opacification Agent Indications:    Dyspnea 786.09 / R06.00  History:        Patient has prior history of Echocardiogram examinations, most                 recent 12/18/2015. Arrythmias:Atrial Fibrillation; Risk                 Factors:Current Smoker. Aortic Stenosis, CHF.  Sonographer:    Leavy Cella Referring Phys: [phone] HAO MENG IMPRESSIONS  1. Left ventricular ejection fraction, by visual estimation, is 25%. The left ventricle has severely decreased function. There is no left ventricular hypertrophy.  2. Definity contrast agent was given IV to delineate the left ventricular endocardial borders.  3. Left ventricular diastolic parameters are indeterminate.  4. The left ventricle demonstrates global hypokinesis.  5. Global right ventricle has mildly reduced systolic function.The right ventricular size is normal. No increase in right ventricular wall thickness.  6. Left atrial size was normal.  7. Right atrial size was normal.  8. Moderate mitral annular calcification.  9. The mitral valve is normal in structure. Trivial mitral valve regurgitation. No evidence of mitral stenosis. 10. The  tricuspid valve is not well visualized. Tricuspid valve regurgitation is not demonstrated. 11. The aortic valve was not well visualized. Aortic valve regurgitation is not visualized. I am concerned based on some images of what I think are gradients across the aortic valve that there is at least moderate aortic stenosis. However, images were extremely poor and I cannot confirm the degree of aortic stenosis. Would consider TEE or repeat echo when the patient is more stable. 12. The inferior vena cava is dilated in size with <50% respiratory variability, suggesting right atrial pressure of 15 mmHg. 13. TR signal is inadequate for assessing pulmonary artery systolic pressure. 14. Trivial pericardial effusion is present. 15. There is a significant left pleural effusion. 16. The patient was in atrial fibrillation with RVR. FINDINGS  Left Ventricle: Left ventricular ejection fraction, by visual estimation, is 25%. The left ventricle has severely decreased function. Definity contrast agent was given IV to delineate the left ventricular endocardial borders. The left ventricle demonstrates global hypokinesis. There is no left ventricular hypertrophy. Left ventricular diastolic parameters are indeterminate. Right Ventricle: The right ventricular size is normal. No increase in right ventricular wall thickness. Global RV systolic function is has mildly reduced systolic function. Left Atrium: Left atrial size was normal in size. Right Atrium: Right atrial size was normal in size Pericardium: Trivial pericardial effusion is present. There is a significant left pleural effusion. Mitral Valve: The mitral valve is normal in structure. Moderate mitral annular calcification. Trivial mitral valve regurgitation. No evidence of mitral valve stenosis by observation. Tricuspid Valve: The tricuspid valve is not well visualized. Tricuspid valve regurgitation is not demonstrated. Aortic Valve: The aortic valve was not well visualized. Aortic  valve regurgitation is not visualized. Uncertain. Pulmonic Valve: The pulmonic valve was not well visualized. Pulmonic valve regurgitation is not visualized. Pulmonic regurgitation is not visualized. Aorta: The aortic root is normal in size and structure. Venous: The inferior vena cava is dilated in size with less than 50% respiratory variability, suggesting right atrial pressure of 15 mmHg. IAS/Shunts: The interatrial septum was not well visualized.   Diastology LV e' lateral:   7.07 cm/s LV E/e' lateral: 18.6 LV e' medial:    5.11 cm/s LV  E/e' medial:  25.7  RIGHT VENTRICLE RV S prime:     20.80 cm/s TAPSE (M-mode): 1.2 cm MITRAL VALVE MV Area (PHT): 5.17 cm MV PHT:        42.53 msec MV Decel Time: 147 msec MR Peak grad: 114.9 mmHg MR Mean grad: 78.0 mmHg MR Vmax:      536.00 cm/s MR Vmean:     417.0 cm/s MV E velocity: 131.33 cm/s 103 cm/s  Marca Ancona MD Electronically signed by Marca Ancona MD Signature Date/Time: 02/17/2019/2:11:13 PM    Final    [place] EKG SITE RITE  Result Date: 02/17/2019 If Site Rite image not attached, placement could not be confirmed due to current cardiac rhythm.  Disposition   No autopsy has been was due to natural causes.  The patient's body will be released to the funeral home requested.    Duration of Discharge Encounter 35 minutes  Greater than 30 minutes including physician time.  Signed, Lesleigh Noe, MD February 22, 2019, 12:39 PM

## 2019-02-28 DEATH — deceased

## 2019-04-05 ENCOUNTER — Ambulatory Visit: Payer: Self-pay | Admitting: Physician Assistant

## 2021-03-01 IMAGING — DX DG CHEST 1V PORT
1 series · 1 of 1 positions shown · non-contrast
Comparison: 12/28/2013

CLINICAL DATA: Shortness of breath

EXAM:
PORTABLE CHEST 1 VIEW

[chest ap]
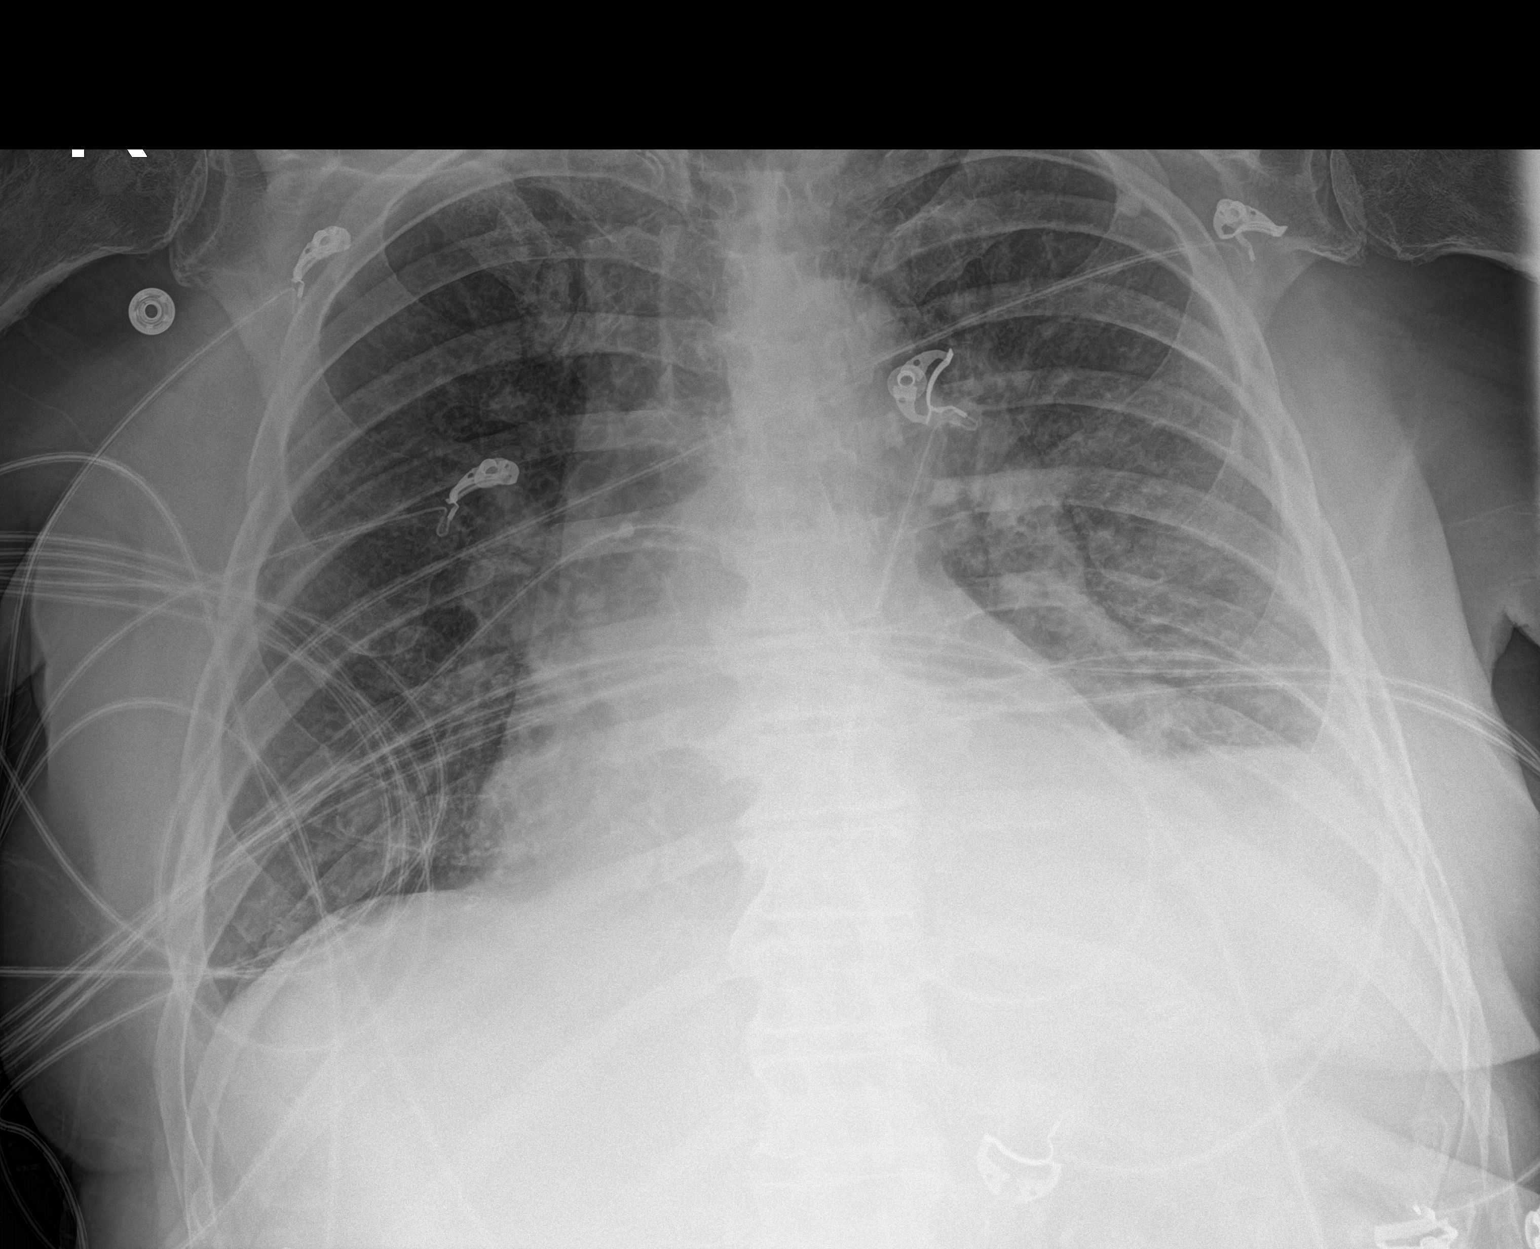

[1 of 1 positions shown; findings below may reference images not displayed]

FINDINGS: Cardiomegaly. Suspect left pleural effusion and associated
atelectasis or consolidation. Benign calcified nodule of the left
upper lobe. The visualized skeletal structures are unremarkable.
IMPRESSION: 1.  Cardiomegaly.

2. Suspect left pleural effusion and associated atelectasis or
consolidation. PA and lateral radiographs may be helpful to further
evaluate.

## 2021-03-03 IMAGING — DX DG CHEST 2V
2 series · 2 of 2 positions shown · non-contrast
Comparison: 02/15/2019

CLINICAL DATA: Shortness of breath, history of CHF and atrial
fibrillation

EXAM:
CHEST - 2 VIEW

[chest ap]
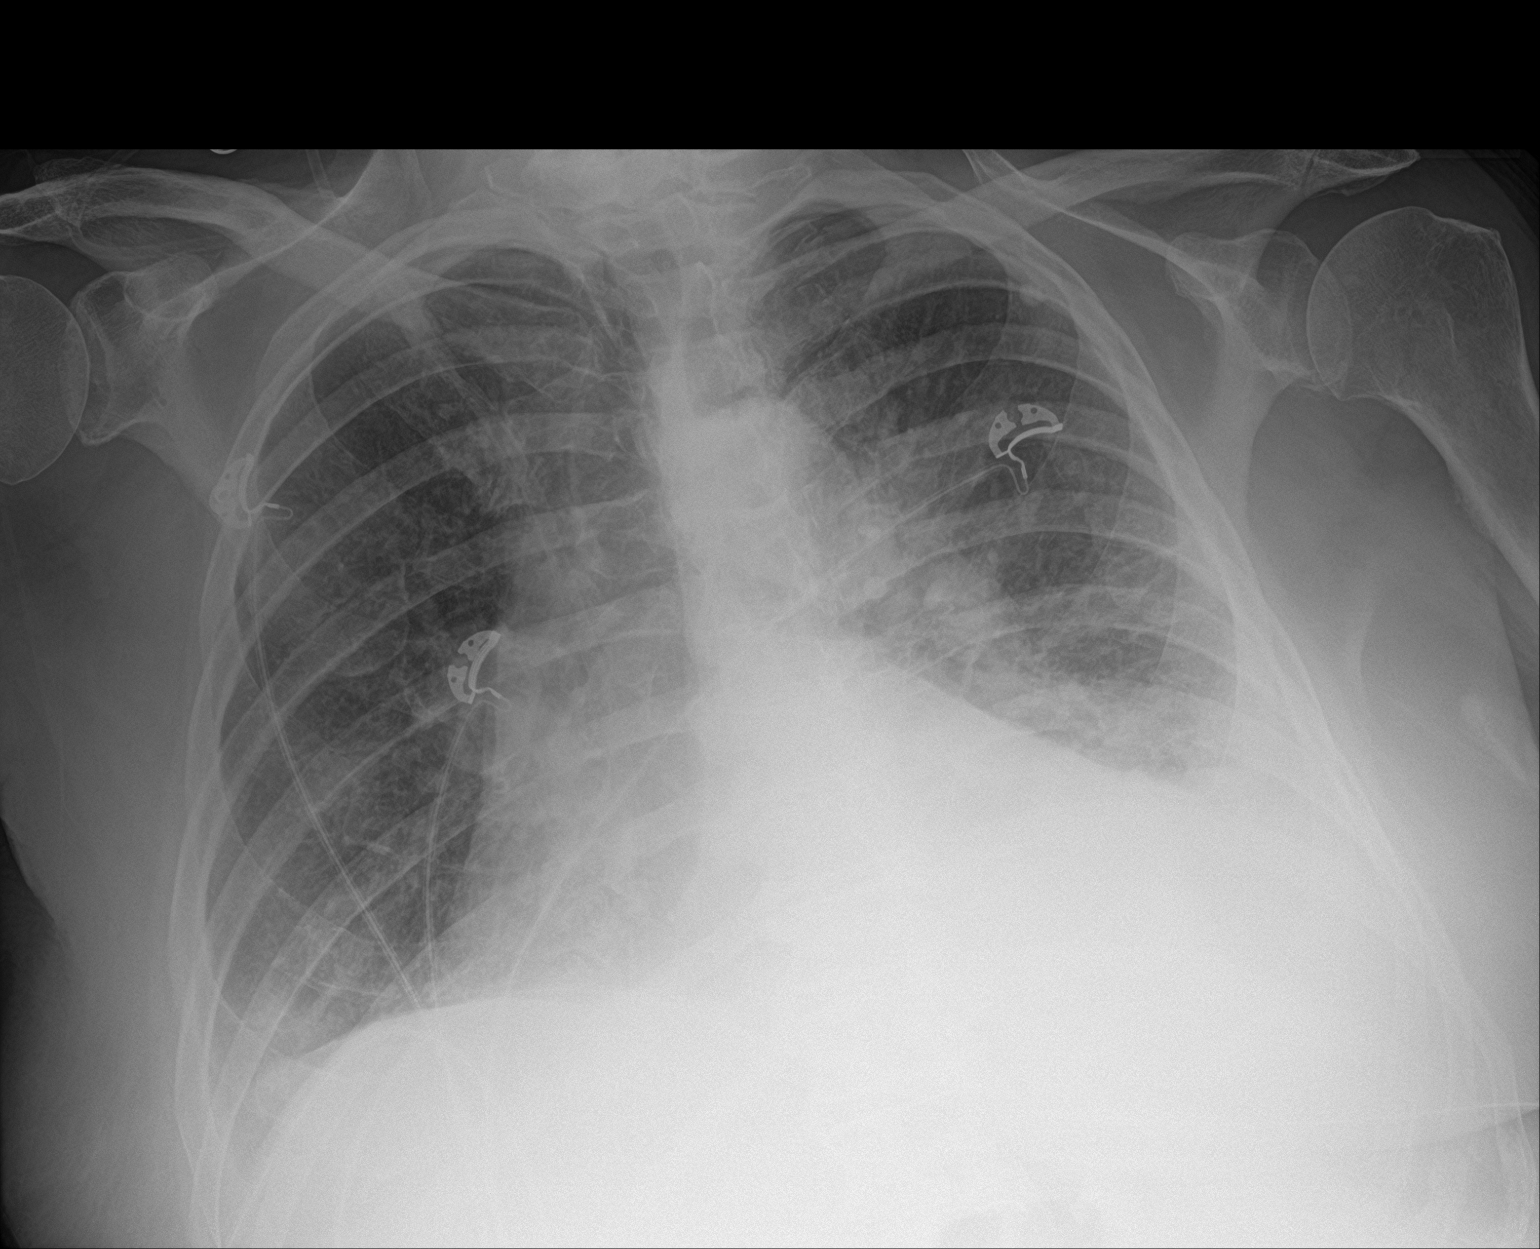

[chest lat]
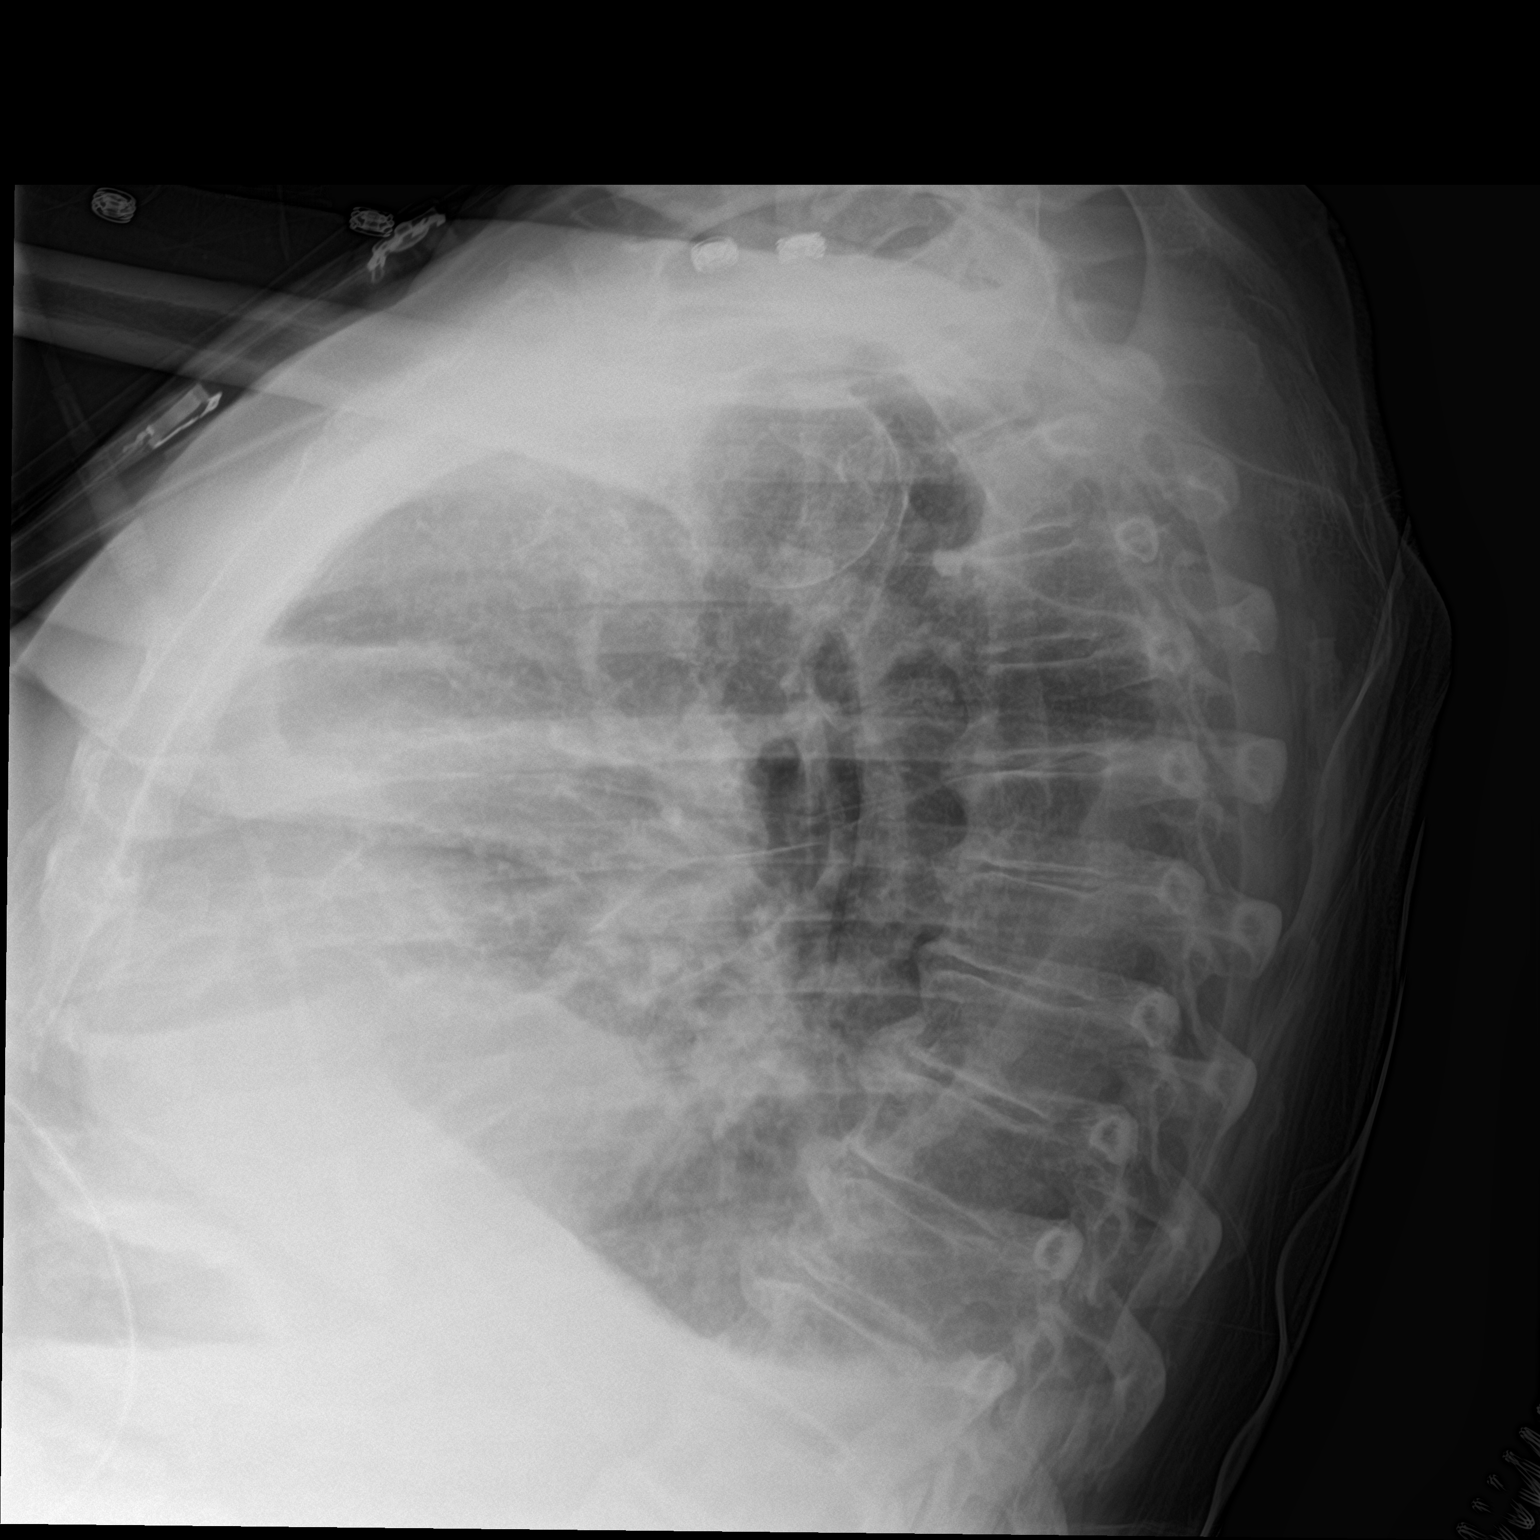

[2 of 2 positions shown; findings below may reference images not displayed]

FINDINGS: Small left pleural effusion. Bibasilar atelectasis. Mild perihilar
edema. Left upper lobe calcified granuloma is again noted. Stable
cardiomediastinal contours.
IMPRESSION: Mild perihilar edema, small left pleural effusion, and bibasilar
atelectasis.
